# Patient Record
Sex: Male | Born: 1980 | Race: White | Hispanic: No | Smoking: Never smoker
Health system: Southern US, Community
[De-identification: ages and names within clinical notes are randomized; demographics above are authoritative.]

## PROBLEM LIST (undated history)

## (undated) DIAGNOSIS — J449 Chronic obstructive pulmonary disease, unspecified: Secondary | ICD-10-CM

## (undated) DIAGNOSIS — I82409 Acute embolism and thrombosis of unspecified deep veins of unspecified lower extremity: Secondary | ICD-10-CM

## (undated) DIAGNOSIS — F419 Anxiety disorder, unspecified: Secondary | ICD-10-CM

## (undated) DIAGNOSIS — I2699 Other pulmonary embolism without acute cor pulmonale: Secondary | ICD-10-CM

## (undated) HISTORY — PX: FRACTURE SURGERY: SHX138

## (undated) HISTORY — DX: Chronic obstructive pulmonary disease, unspecified: J44.9

## (undated) HISTORY — DX: Anxiety disorder, unspecified: F41.9

---

## 2005-03-10 ENCOUNTER — Emergency Department: Payer: Self-pay | Admitting: Unknown Physician Specialty

## 2006-09-17 ENCOUNTER — Ambulatory Visit: Payer: Self-pay | Admitting: Family Medicine

## 2007-11-23 ENCOUNTER — Emergency Department: Payer: Self-pay | Admitting: Emergency Medicine

## 2008-01-08 ENCOUNTER — Ambulatory Visit: Payer: Self-pay | Admitting: Orthopedic Surgery

## 2008-01-09 ENCOUNTER — Ambulatory Visit: Payer: Self-pay | Admitting: Orthopedic Surgery

## 2008-12-28 ENCOUNTER — Ambulatory Visit: Payer: Self-pay | Admitting: Internal Medicine

## 2011-09-14 ENCOUNTER — Emergency Department: Payer: Self-pay | Admitting: Emergency Medicine

## 2011-09-14 LAB — PROTIME-INR
INR: 1
Prothrombin Time: 13.5 secs (ref 11.5–14.7)

## 2011-09-14 LAB — COMPREHENSIVE METABOLIC PANEL
Albumin: 4.2 g/dL (ref 3.4–5.0)
Alkaline Phosphatase: 54 U/L (ref 50–136)
BUN: 5 mg/dL — ABNORMAL LOW (ref 7–18)
Bilirubin,Total: 1 mg/dL (ref 0.2–1.0)
Calcium, Total: 9 mg/dL (ref 8.5–10.1)
Chloride: 102 mmol/L (ref 98–107)
Co2: 30 mmol/L (ref 21–32)
Creatinine: 0.85 mg/dL (ref 0.60–1.30)
EGFR (African American): >60
Osmolality: 275 (ref 275–301)
SGOT(AST): 16 U/L (ref 15–37)
SGPT (ALT): 17 U/L
Total Protein: 7.4 g/dL (ref 6.4–8.2)

## 2011-09-14 LAB — CBC
HGB: 15.7 g/dL (ref 13.0–18.0)
MCH: 31.7 pg (ref 26.0–34.0)
MCV: 92 fL (ref 80–100)
Platelet: 250 10*3/uL (ref 150–440)
RDW: 13.3 % (ref 11.5–14.5)

## 2011-09-14 LAB — TROPONIN I: Troponin-I: <0.02 ng/mL

## 2011-09-14 LAB — CK TOTAL AND CKMB (NOT AT ARMC): CK-MB: <0.5 ng/mL — ABNORMAL LOW (ref 0.5–3.6)

## 2012-04-16 ENCOUNTER — Emergency Department: Payer: Self-pay

## 2012-04-16 LAB — BASIC METABOLIC PANEL
Anion Gap: 11 (ref 7–16)
Chloride: 105 mmol/L (ref 98–107)
Co2: 25 mmol/L (ref 21–32)
EGFR (Non-African Amer.): >60
Osmolality: 280 (ref 275–301)
Potassium: 3.7 mmol/L (ref 3.5–5.1)
Sodium: 141 mmol/L (ref 136–145)

## 2012-04-16 LAB — CBC
HCT: 49.1 % (ref 40.0–52.0)
HGB: 16.4 g/dL (ref 13.0–18.0)
MCH: 31.7 pg (ref 26.0–34.0)
MCHC: 33.5 g/dL (ref 32.0–36.0)
MCV: 95 fL (ref 80–100)
Platelet: 282 10*3/uL (ref 150–440)
RDW: 12.4 % (ref 11.5–14.5)

## 2013-01-04 ENCOUNTER — Emergency Department: Payer: Self-pay | Admitting: Emergency Medicine

## 2013-07-24 ENCOUNTER — Emergency Department: Payer: Self-pay | Admitting: Emergency Medicine

## 2013-07-24 LAB — RAPID INFLUENZA A&B ANTIGENS

## 2013-10-14 IMAGING — US US EXTREM LOW VENOUS*R*
1 series · 14 of 24 positions shown · non-contrast
Comparison: none

REASON FOR EXAM: pain; h/o DVT
COMMENTS:

[Series 1: us extrem low venous*right* · 0.07mm/px · 14 of 32 slices shown]
[im 1/32]
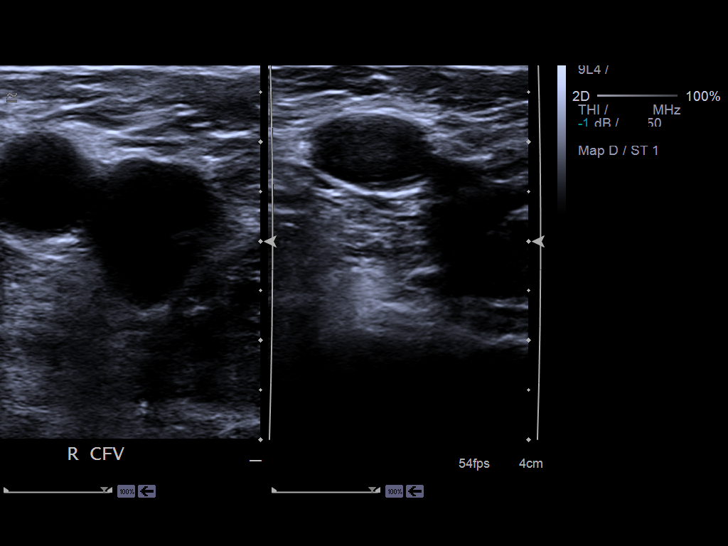
[im 3/32]
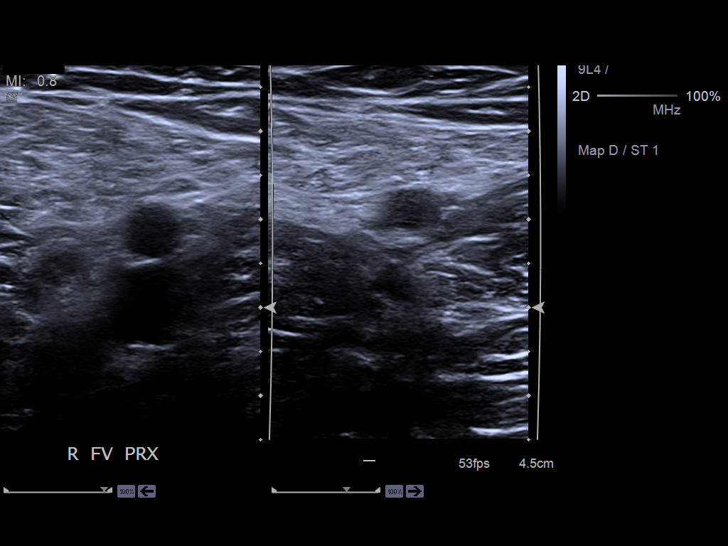
[im 6/32]
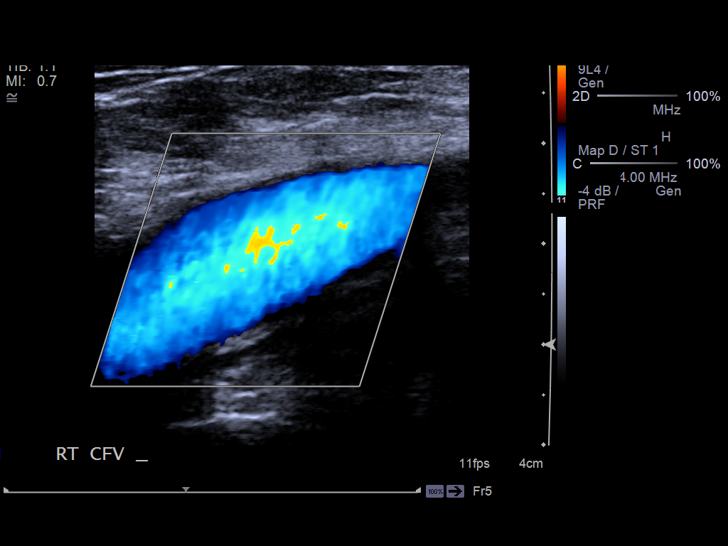
[im 9/32]
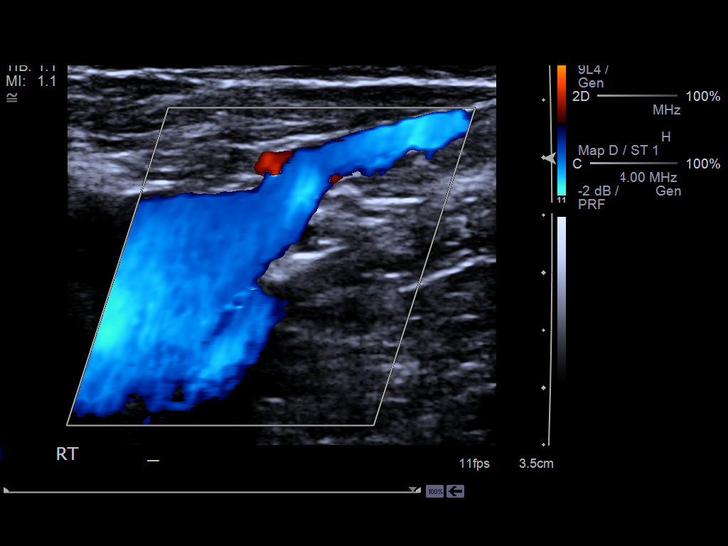
[im 10/32]
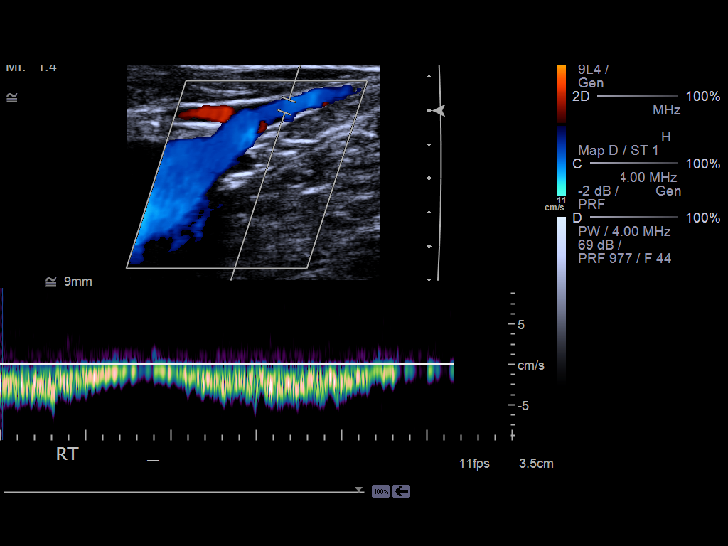
[im 13/32]
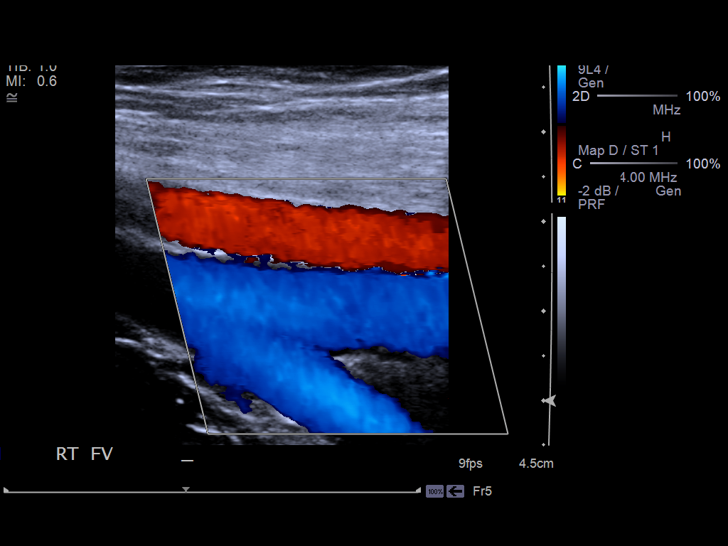
[im 15/32]
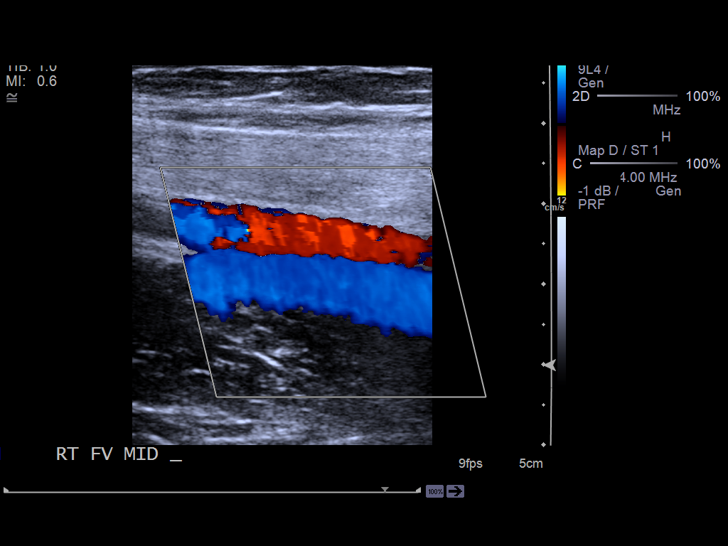
[im 17/32]
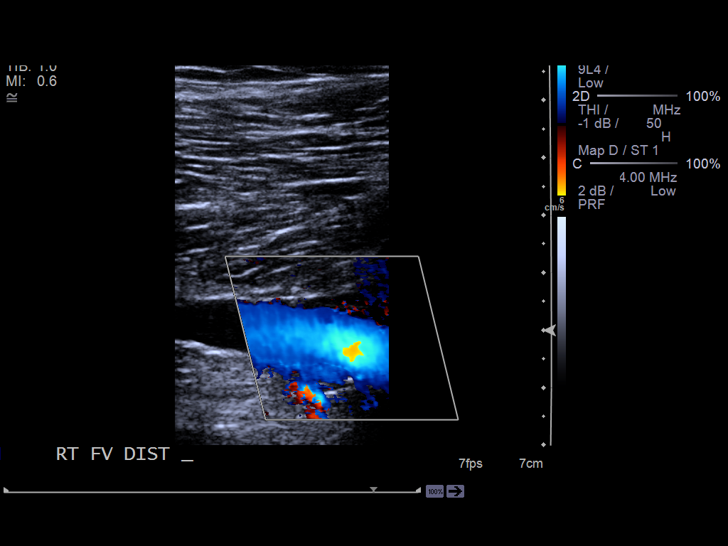
[im 19/32]
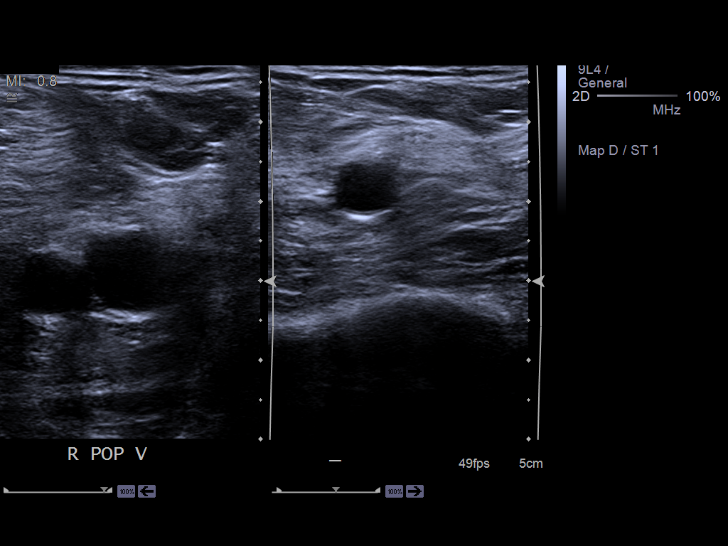
[im 22/32]
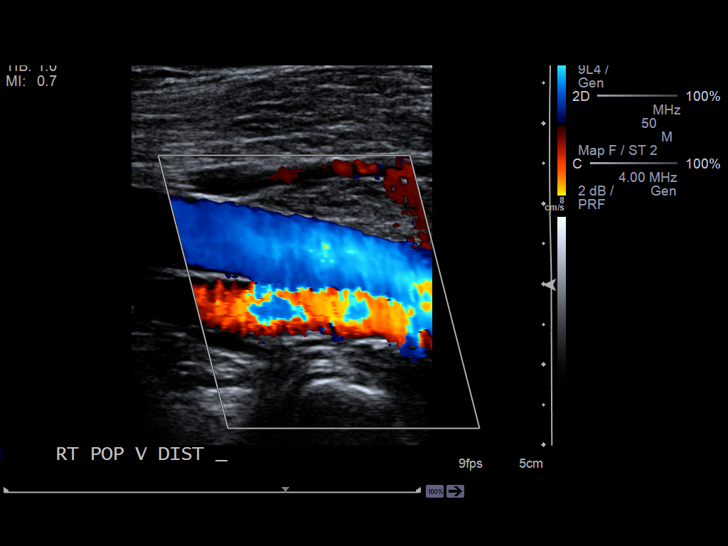
[im 25/32]
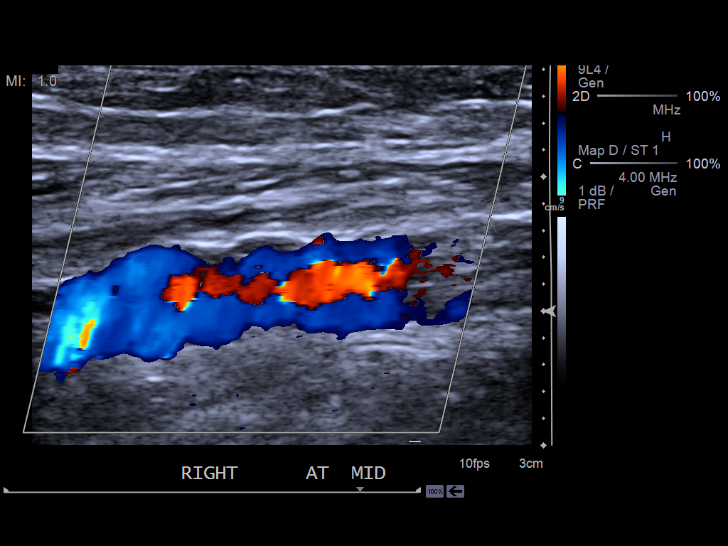
[im 26/32]
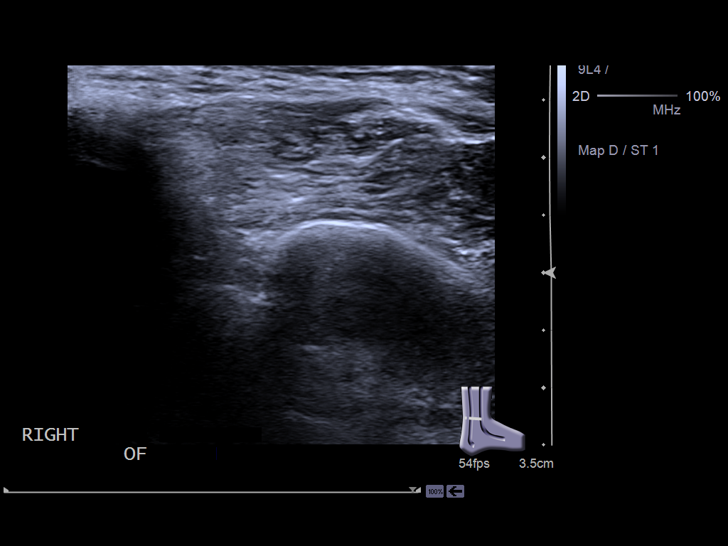
[im 29/32]
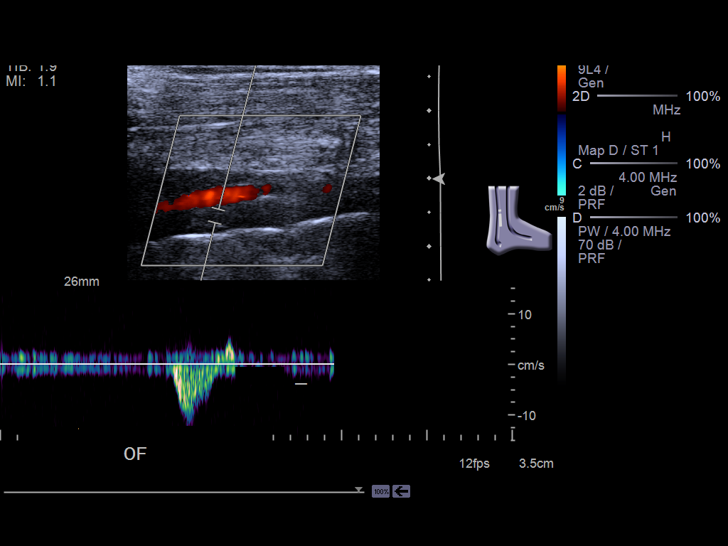
[im 32/32]
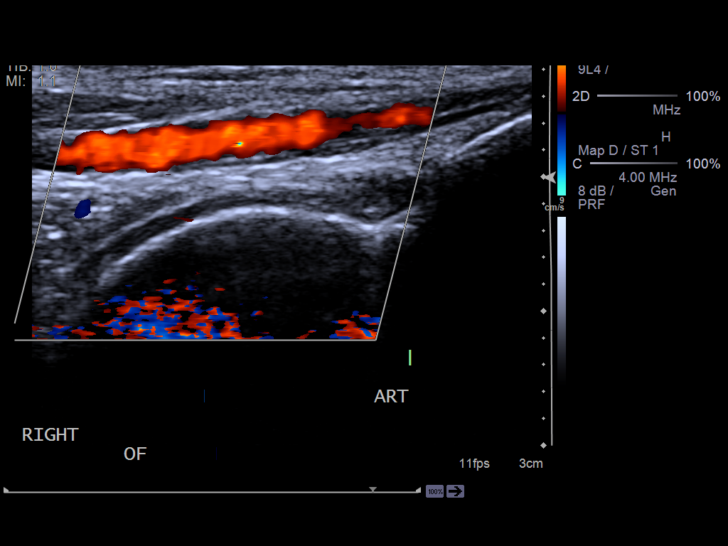

[14 of 24 positions shown; findings below may reference images not displayed]

PROCEDURE:     US  - US DOPPLER LOW EXTR RIGHT  - April 16, 2012  [DATE]

RESULT:     The right common femoral, superficial femoral, and popliteal
veins were interrogated using grayscale and color flow Doppler.

The vessels are normally compressible. The waveform patterns are normal and
the color flow images are normal. The response to the augmentation and
Valsalva maneuvers is normal. Survey views of the area of discomfort over
the lateral ankle reveals no suspicious findings.
IMPRESSION: There is no evidence of thrombus within the right femoral
or popliteal veins.

[REDACTED]

## 2013-12-31 ENCOUNTER — Emergency Department: Payer: Self-pay | Admitting: Emergency Medicine

## 2014-01-12 ENCOUNTER — Ambulatory Visit: Payer: Self-pay | Admitting: Internal Medicine

## 2014-01-12 LAB — COMPREHENSIVE METABOLIC PANEL
ALBUMIN: 3.9 g/dL (ref 3.4–5.0)
ALT: 34 U/L (ref 12–78)
AST: 32 U/L (ref 15–37)
Alkaline Phosphatase: 63 U/L
Anion Gap: 6 — ABNORMAL LOW (ref 7–16)
BUN: 6 mg/dL — AB (ref 7–18)
Bilirubin,Total: 0.9 mg/dL (ref 0.2–1.0)
CALCIUM: 8.5 mg/dL (ref 8.5–10.1)
CHLORIDE: 103 mmol/L (ref 98–107)
CREATININE: 0.92 mg/dL (ref 0.60–1.30)
Co2: 28 mmol/L (ref 21–32)
EGFR (African American): >60
EGFR (Non-African Amer.): >60
GLUCOSE: 91 mg/dL (ref 65–99)
Osmolality: 271 (ref 275–301)
POTASSIUM: 3.7 mmol/L (ref 3.5–5.1)
SODIUM: 137 mmol/L (ref 136–145)
Total Protein: 7.3 g/dL (ref 6.4–8.2)

## 2014-01-12 LAB — CBC CANCER CENTER
BASOS ABS: 0.1 x10 3/mm (ref 0.0–0.1)
BASOS PCT: 1 %
EOS PCT: 3.1 %
Eosinophil #: 0.2 x10 3/mm (ref 0.0–0.7)
HCT: 47.7 % (ref 40.0–52.0)
HGB: 16.2 g/dL (ref 13.0–18.0)
LYMPHS PCT: 29.1 %
Lymphocyte #: 2.1 x10 3/mm (ref 1.0–3.6)
MCH: 32.4 pg (ref 26.0–34.0)
MCHC: 33.9 g/dL (ref 32.0–36.0)
MCV: 96 fL (ref 80–100)
MONO ABS: 0.5 x10 3/mm (ref 0.2–1.0)
Monocyte %: 7 %
NEUTROS PCT: 59.8 %
Neutrophil #: 4.4 x10 3/mm (ref 1.4–6.5)
PLATELETS: 290 x10 3/mm (ref 150–440)
RBC: 4.99 10*6/uL (ref 4.40–5.90)
RDW: 13 % (ref 11.5–14.5)
WBC: 7.3 x10 3/mm (ref 3.8–10.6)

## 2014-01-12 LAB — TSH: THYROID STIMULATING HORM: 2.93 u[IU]/mL

## 2014-01-23 ENCOUNTER — Ambulatory Visit: Payer: Self-pay | Admitting: Internal Medicine

## 2014-07-04 IMAGING — CR DG FOOT COMPLETE 3+V*L*
1 series · 3 of 3 positions shown · non-contrast
Comparison: none

REASON FOR EXAM: basketball injury; unable to bear weight
COMMENTS:

PROCEDURE:     DXR - DXR FOOT LT COMP W/OBLIQUES  - January 04, 2013 [DATE]
RESULT:     Comparison: None.

[Series 4: x foot ap left · 0.14mm/px · 3 of 3 slices shown]
[im 1/3]
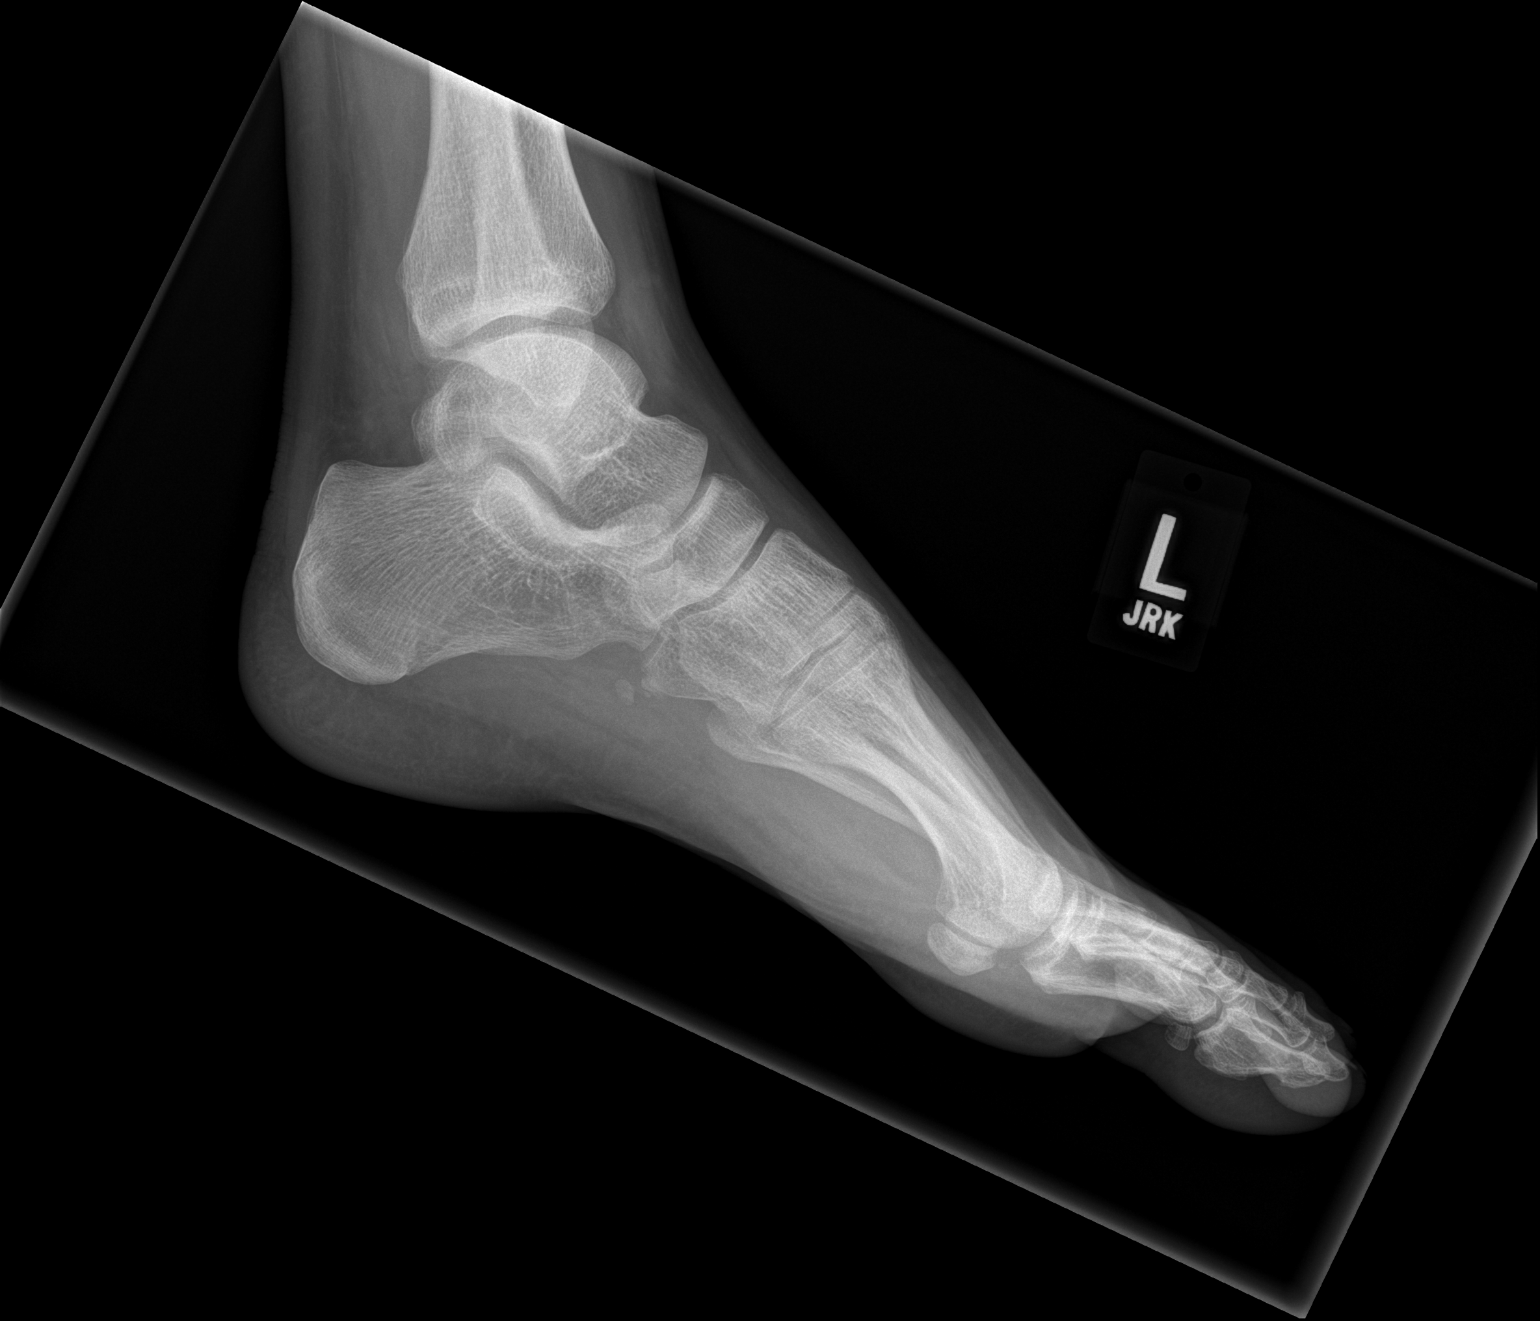
[im 2/3]
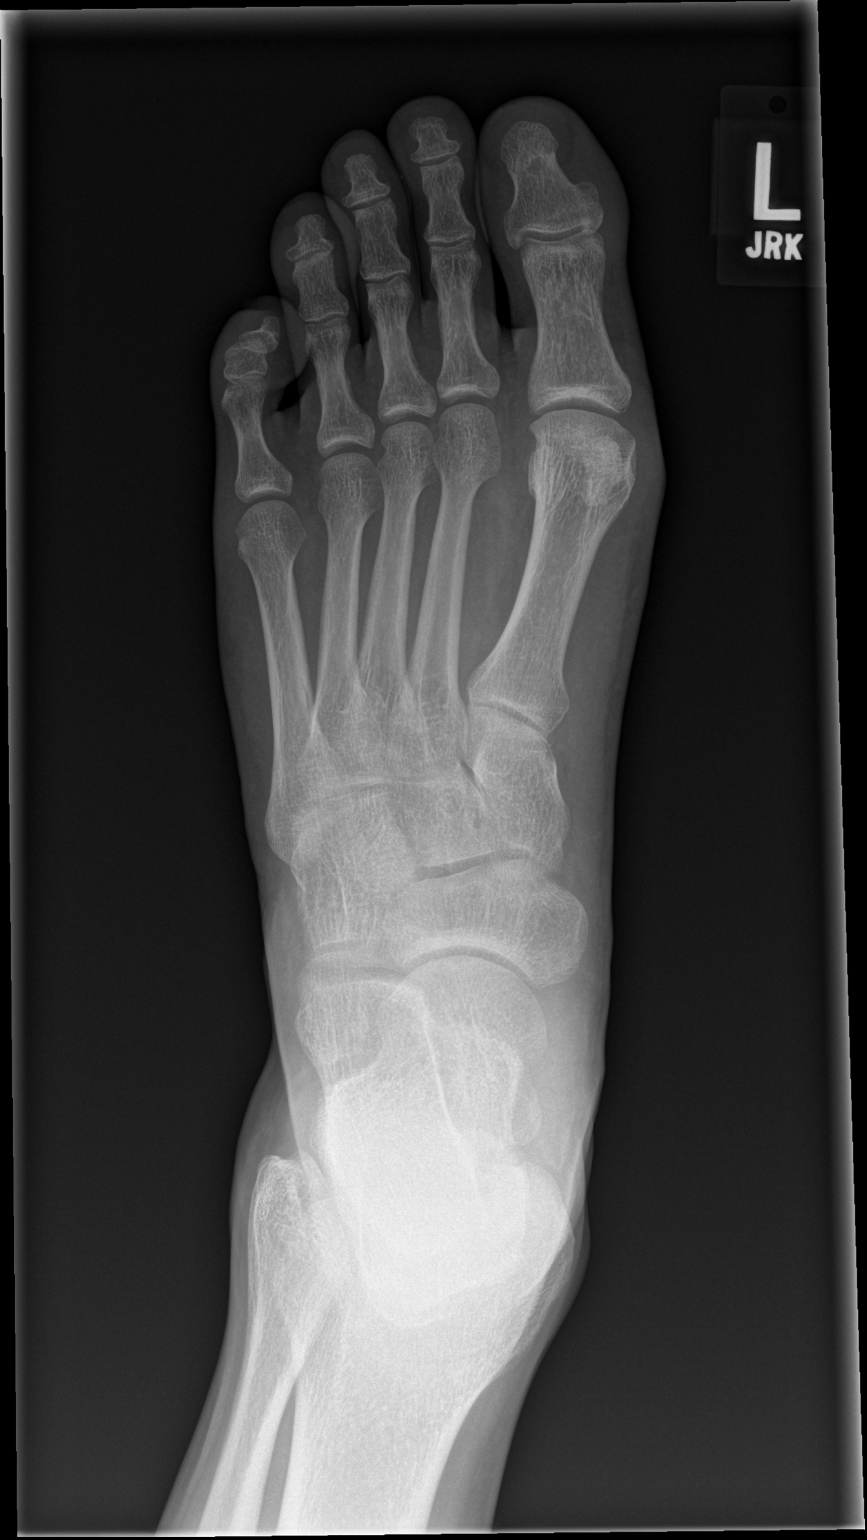
[im 3/3]
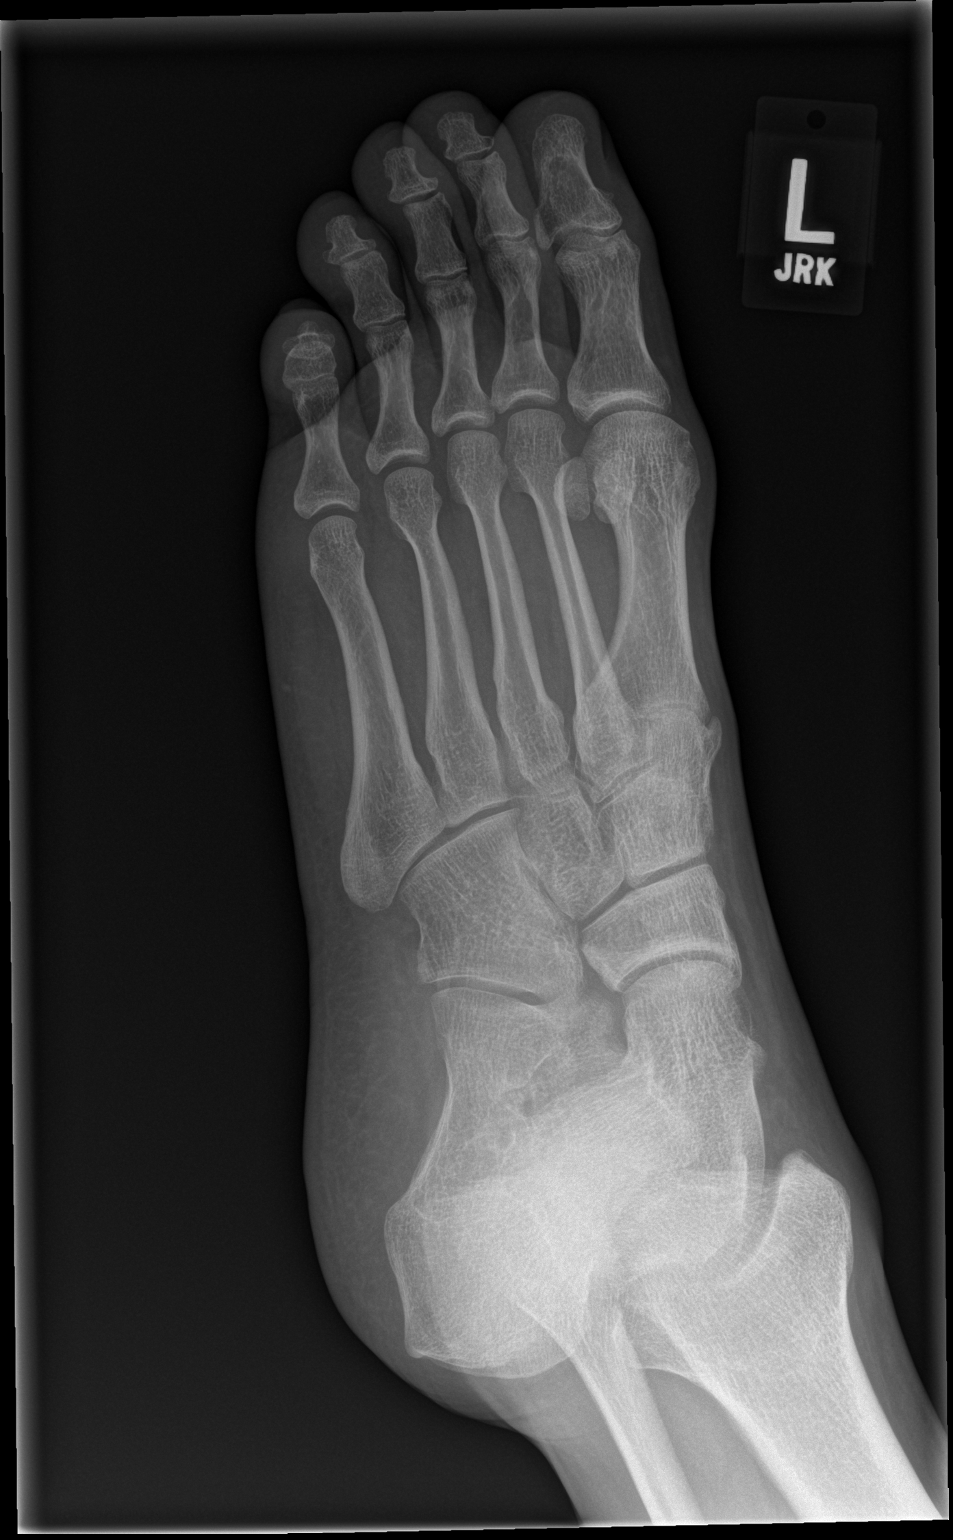

[3 of 3 positions shown; findings below may reference images not displayed]

FINDINGS: There are 2 small ossific densities just inferior to the calcaneocuboid
articulation. There is normal alignment in the foot.
IMPRESSION: Please see calcaneus radiograph report regarding small ossific densities
inferior to the calcaneocuboid articulation. Otherwise, no acute fracture
seen in the remainder of the foot.

[REDACTED]

## 2016-11-12 ENCOUNTER — Other Ambulatory Visit: Payer: Self-pay | Admitting: Family Medicine

## 2016-11-12 ENCOUNTER — Encounter: Payer: Self-pay | Admitting: Family Medicine

## 2017-01-11 ENCOUNTER — Ambulatory Visit: Payer: Self-pay | Admitting: Family Medicine

## 2017-01-21 ENCOUNTER — Ambulatory Visit: Payer: Medicaid Other | Admitting: Family Medicine

## 2017-02-20 ENCOUNTER — Ambulatory Visit: Payer: Medicaid Other | Admitting: Family Medicine

## 2017-04-02 ENCOUNTER — Emergency Department: Payer: Medicaid Other

## 2017-04-02 ENCOUNTER — Emergency Department
Admission: EM | Admit: 2017-04-02 | Discharge: 2017-04-02 | Disposition: A | Discharge status: Home / Self Care | Payer: Medicaid Other | Attending: Emergency Medicine | Admitting: Emergency Medicine

## 2017-04-02 DIAGNOSIS — Z86711 Personal history of pulmonary embolism: Secondary | ICD-10-CM | POA: Insufficient documentation

## 2017-04-02 DIAGNOSIS — I82442 Acute embolism and thrombosis of left tibial vein: Secondary | ICD-10-CM | POA: Insufficient documentation

## 2017-04-02 DIAGNOSIS — M79605 Pain in left leg: Secondary | ICD-10-CM | POA: Diagnosis present

## 2017-04-02 HISTORY — DX: Other pulmonary embolism without acute cor pulmonale: I26.99

## 2017-04-02 HISTORY — DX: Acute embolism and thrombosis of unspecified deep veins of unspecified lower extremity: I82.409

## 2017-04-02 LAB — CBC WITH DIFFERENTIAL/PLATELET
Basophils Absolute: 0.1 10*3/uL (ref 0–0.1)
Basophils Relative: 1 %
EOS ABS: 0.2 10*3/uL (ref 0–0.7)
Eosinophils Relative: 3 %
HCT: 49.6 % (ref 40.0–52.0)
HEMOGLOBIN: 17 g/dL (ref 13.0–18.0)
LYMPHS ABS: 1.1 10*3/uL (ref 1.0–3.6)
Lymphocytes Relative: 16 %
MCH: 31.5 pg (ref 26.0–34.0)
MCHC: 34.3 g/dL (ref 32.0–36.0)
MCV: 91.7 fL (ref 80.0–100.0)
MONOS PCT: 9 %
Monocytes Absolute: 0.6 10*3/uL (ref 0.2–1.0)
Neutro Abs: 5.1 10*3/uL (ref 1.4–6.5)
Neutrophils Relative %: 71 %
Platelets: 290 10*3/uL (ref 150–440)
RBC: 5.41 MIL/uL (ref 4.40–5.90)
RDW: 13.3 % (ref 11.5–14.5)
WBC: 7.1 10*3/uL (ref 3.8–10.6)

## 2017-04-02 LAB — BASIC METABOLIC PANEL
Anion gap: 7 (ref 5–15)
BUN: 10 mg/dL (ref 6–20)
CHLORIDE: 107 mmol/L (ref 101–111)
CO2: 25 mmol/L (ref 22–32)
CREATININE: 0.94 mg/dL (ref 0.61–1.24)
Calcium: 9 mg/dL (ref 8.9–10.3)
GFR calc Af Amer: >60 mL/min (ref >60)
GFR calc non Af Amer: >60 mL/min (ref >60)
Glucose, Bld: 104 mg/dL — ABNORMAL HIGH (ref 65–99)
Potassium: 4.4 mmol/L (ref 3.5–5.1)
SODIUM: 139 mmol/L (ref 135–145)

## 2017-04-02 LAB — PROTIME-INR
INR: 1.07
PROTHROMBIN TIME: 13.8 s (ref 11.4–15.2)

## 2017-04-02 LAB — APTT: aPTT: 30 seconds (ref 24–36)

## 2017-04-02 MED ORDER — APIXABAN 5 MG PO TABS: ORAL_TABLET | ORAL | 2 refills | Status: DC

## 2017-04-02 NOTE — Discharge Instructions (Signed)
You have been seen in the Emergency Department (ED) today and diagnosed with a deep vein thrombosis (DVT), which is a blood clot in one of your veins.  As we discussed, we are starting you on a blood thinner.  Though these carry with them the risk of bleeding, the risks of not treating your DVT (stroke, a clot in your lungs, etc.) are greater than the risk of taking the medication. ° °Please follow up with the doctor listed in these papers as recommended regarding today's ED visit.     ° °Return to the ED if you have worsening pain, ANY trouble breathing, chest pain, or other new or worsening symptoms that concern you. ° °

## 2017-04-02 NOTE — ED Provider Notes (Signed)
[facility] Emergency Department Provider Note  ____________________________________________   First MD Initiated Contact with Patient 04/02/17 1710     (approximate)  I have reviewed the triage vital signs and the nursing notes.   HISTORY  Chief Complaint Leg Pain    HPI Matthew Graves is a 36 y.o. 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  He reports that it feels similar to prior blood clots.  He has had no swelling and no numbness nor tingling.  The pain is mild to moderate and worse with ambulation and when his leg is not elevated. It starts just below his left knee and is primarily in the calf, radiating up and down the leg.  he denies fever/chills, chest pain, shortness of breath, nausea, vomiting, and abdominal pain.  He reports that years ago when he was first diagnosed he had an extensive workup at the cancer center but they could not find the cause of his coagulation issues.  He was on Xarelto for "a while", but he did not like the way it made him feel so he stopped taking it and has not taken it for years.  He has not had any recent traumas, no recent surgeries, no recent immobilizations nor long trips.   Past Medical History:  Diagnosis Date  . DVT (deep venous thrombosis) (HCC)    multiple, in both upper and lower extremities  . Pulmonary embolism (HCC)     There are no active problems to display for this patient.   History reviewed. No pertinent surgical history.  Prior to Admission medications   Medication Sig Start Date End Date Taking? Authorizing Provider  apixaban (ELIQUIS) 5 MG TABS tablet Take 2 tablets (10 mg) PO BID x 7 days, then reduce dose to 1 tablet (5 mg) PO BID. 04/02/17   Loleta Rose, MD    Allergies Patient has no known  allergies.  History reviewed. No pertinent family history.  Social History Social History  Substance Use Topics  . Smoking status: Never Smoker  . Smokeless tobacco: Never Used  . Alcohol use Yes    Review of Systems Constitutional: No fever/chills Eyes: No visual changes. ENT: No sore throat. Cardiovascular: Denies chest pain. Respiratory: Denies shortness of breath. Gastrointestinal: No abdominal pain.  No nausea, no vomiting.  No diarrhea.  No constipation. Genitourinary: Negative for dysuria. Musculoskeletal: pain in the left lower extremity from the knee down with no swelling. Negative for neck pain.  Negative for back pain. Integumentary: Negative for rash. Neurological: Negative for headaches, focal weakness or numbness.   ____________________________________________   PHYSICAL EXAM:  VITAL SIGNS: ED Triage Vitals [04/02/17 1450]  Enc Vitals Group     BP 137/73     Pulse Rate 99     Resp 18     Temp 98.3 F (36.8 C)     Temp Source Oral     SpO2 96 %     Weight 86.2 kg (190 lb)     Height 1.88 m ( )     Head Circumference      Peak Flow      Pain Score 3     Pain Loc      Pain Edu?      Excl. in GC?     Constitutional: Alert and oriented. Well  appearing and in no acute distress. Eyes: Conjunctivae are normal.  Head: Atraumatic. Nose: No congestion/rhinnorhea. Mouth/Throat: Mucous membranes are moist. Neck: No stridor.  No meningeal signs.   Cardiovascular: Normal rate, regular rhythm. Good peripheral circulation. Grossly normal heart sounds. Respiratory: Normal respiratory effort.  No retractions. Lungs CTAB. Gastrointestinal: Soft and nontender. No distention.  Musculoskeletal: No lower extremity tenderness nor edema. No gross deformities of extremities. no swelling of the left lower extremity compared to the right.  No palpable cords behind the knee in the popliteal fossa.  Neurovascularly intact with palpable pulses in the left foot.    Neurologic:  Normal speech and language. No gross focal neurologic deficits are appreciated.  Skin:  Skin is warm, dry and intact. No rash noted. Psychiatric: Mood and affect are normal. Speech and behavior are normal.  ____________________________________________   LABS (all labs ordered are listed, but only abnormal results are displayed)  Labs Reviewed  BASIC METABOLIC PANEL - Abnormal; Notable for the following:       Result Value   Glucose, Bld 104 (*)    All other components within normal limits  CBC WITH DIFFERENTIAL/PLATELET  PROTIME-INR  APTT   ____________________________________________  EKG  None - EKG not ordered by ED physician ____________________________________________  RADIOLOGY   US Venous Img Lower Unilateral Left  Result Date: 04/02/2017 CLINICAL DATA:  Pain.  Evaluate for blood clot. EXAM: Left LOWER EXTREMITY VENOUS DOPPLER ULTRASOUND TECHNIQUE: Gray-scale sonography with graded compression, as well as color Doppler and duplex ultrasound were performed to evaluate the lower extremity deep venous systems from the level of the common femoral vein and including the common femoral, femoral, profunda femoral, popliteal and calf veins including the posterior tibial, peroneal and gastrocnemius veins when visible. The superficial great saphenous vein was also interrogated. Spectral Doppler was utilized to evaluate flow at rest and with distal augmentation maneuvers in the common femoral, femoral and popliteal veins. COMPARISON:  None. FINDINGS: Contralateral Common Femoral Vein: Respiratory phasicity is normal and symmetric with the symptomatic side. No evidence of thrombus. Normal compressibility. Common Femoral Vein: No evidence of thrombus. Normal compressibility, respiratory phasicity and response to augmentation. Saphenofemoral Junction: No evidence of thrombus. Normal compressibility and flow on color Doppler imaging. Profunda Femoral Vein: No evidence of  thrombus. Normal compressibility and flow on color Doppler imaging. Femoral Vein: No evidence of thrombus. Normal compressibility, respiratory phasicity and response to augmentation. Popliteal Vein: No evidence of thrombus. Normal compressibility, respiratory phasicity and response to augmentation. Calf Veins: Occlusive thrombus identified within the posterior tibial vein and both peroneal veins. Superficial Great Saphenous Vein: No evidence of thrombus. Normal compressibility and flow on color Doppler imaging. Venous Reflux:  None. Other Findings:  None. IMPRESSION: 1. Examination is positive for occlusive thrombus within the posterior tibial vein of the left lower extremity and both peroneal veins of the left calf. These results will be called to the ordering clinician or representative by the Radiologist Assistant, and communication documented in the PACS or zVision Dashboard. Electronically Signed   By: Signa Kell M.D.   On: 04/02/2017 15:56    ____________________________________________   PROCEDURES  Critical Care performed: No   Procedure(s) performed:   Procedures   ____________________________________________   INITIAL IMPRESSION / ASSESSMENT AND PLAN / ED COURSE      when I saw the patient the ultrasound had already come back and demonstrated occlusive DVT in the posterior tibial and peroneal veins of the left calf.  He is neurovascularly intact and has  good arterial supply with minimal symptoms.  We had a discussion about medication compliance.  He is willing to start on L Oquist to try that as an alternative to see her also and he states that he will follow up with hematology.  I will give him contact information so that he can follow-up.  I am sending Baseline labs, but he has no history of any renal issues and has a healthy body habitus with no other known medical problems.  He needs to hurry to get to the pharmacy so I will not keep him for the lab results but rather  discharged him with my usual and customary return precautions.  He agrees with the plan.     ____________________________________________  FINAL CLINICAL IMPRESSION(S) / ED DIAGNOSES  Final diagnoses:  Acute deep vein thrombosis (DVT) of tibial vein of left lower extremity (HCC)     MEDICATIONS GIVEN DURING THIS VISIT:  Medications - No data to display   NEW OUTPATIENT MEDICATIONS STARTED DURING THIS VISIT:  Discharge Medication List as of 04/02/2017  6:07 PM    START taking these medications   Details  apixaban (ELIQUIS) 5 MG TABS tablet Take 2 tablets (10 mg) PO BID x 7 days, then reduce dose to 1 tablet (5 mg) PO BID., Print        Discharge Medication List as of 04/02/2017  6:07 PM      Discharge Medication List as of 04/02/2017  6:07 PM       Note:  This document was prepared using Dragon voice recognition software and may include unintentional dictation errors.    Loleta Rose, MD 04/02/17 2030

## 2017-04-02 NOTE — ED Triage Notes (Signed)
Pain to left lower leg behind knee down to left foot x 1 week. Hx of blood clots. Pt prescribed Xarelto but is non compliant with it. Pt alert and oriented X4, active, cooperative, pt in NAD. RR even and unlabored, color WNL.

## 2017-04-05 ENCOUNTER — Ambulatory Visit: Payer: Medicaid Other | Admitting: Family Medicine

## 2017-05-28 ENCOUNTER — Ambulatory Visit: Payer: Medicaid Other | Admitting: Family Medicine

## 2017-05-28 ENCOUNTER — Encounter: Payer: Self-pay | Admitting: Family Medicine

## 2017-05-28 VITALS — BP 137/96 | HR 99 | Temp 98.4°F | Ht 73.3 in | Wt 190.5 lb

## 2017-05-28 DIAGNOSIS — J449 Chronic obstructive pulmonary disease, unspecified: Secondary | ICD-10-CM | POA: Insufficient documentation

## 2017-05-28 DIAGNOSIS — I82432 Acute embolism and thrombosis of left popliteal vein: Secondary | ICD-10-CM | POA: Diagnosis not present

## 2017-05-28 DIAGNOSIS — K4090 Unilateral inguinal hernia, without obstruction or gangrene, not specified as recurrent: Secondary | ICD-10-CM | POA: Diagnosis not present

## 2017-05-28 DIAGNOSIS — F419 Anxiety disorder, unspecified: Secondary | ICD-10-CM

## 2017-05-28 DIAGNOSIS — Z86711 Personal history of pulmonary embolism: Secondary | ICD-10-CM | POA: Diagnosis not present

## 2017-05-28 DIAGNOSIS — Z86718 Personal history of other venous thrombosis and embolism: Secondary | ICD-10-CM | POA: Diagnosis not present

## 2017-05-28 DIAGNOSIS — I82409 Acute embolism and thrombosis of unspecified deep veins of unspecified lower extremity: Secondary | ICD-10-CM | POA: Diagnosis not present

## 2017-05-28 MED ORDER — CITALOPRAM HYDROBROMIDE 20 MG PO TABS: 20.0000 mg | ORAL_TABLET | Freq: Every day | ORAL | 3 refills | Status: DC

## 2017-05-28 NOTE — Progress Notes (Signed)
BP (!) 137/96 (BP Location: Left Arm, Patient Position: Sitting, Cuff Size: Normal)   Pulse 99   Temp 98.4 F (36.9 C)   Ht 6' 1.3" (1.862 m)   Wt 190 lb 8 oz (86.4 kg)   SpO2 99%   BMI 24.93 kg/m    Subjective:    Patient ID: Matthew Graves, male    DOB: August 09, 1980, 36 y.o.   MRN: [id number]  HPI: Matthew Graves is a 36 y.o. male who presents today to establish care.   Chief Complaint  Patient presents with  . Other    Recurrent blood clots  . Hernia  . COPD  . Anxiety   Diagnosed with DVT October 8th- on the eliquis. First 1 on Feb 2010 and had a PE with that one too. Had to have shots and go once a week. Has been on coumadin, xarelto, warfarin and eliquis. Has come off of all of them in the past because of the tiredness and then he'll get another clot. He has had 4 in the last 8 years. Sat in a deer stand for about 3 hours before the last one. No other provoking factors. Has had them in his arms as well. His grandparents on his father's side had blood clots but in old age. No other family history.   Has had issues with his breathing since he was a kid. Had sports induced asthma. Now has COPD. Has some inhalers at home, but doesn't seem to take them. He notes that the best thing he has done was quitting smoker. Has rescue inhalers at home, but has never been on any preventive medicine.   HERNIA Duration: 1 year Location: right groin Painful: Not any more Discomfort: yes- very rarely Bulge: no Quality:  none Onset: sudden Severity: mild Context: better Aggravating factors: moving a certain way  ANXIETY/STRESS- since childhood, nervous in crowds, doesn't like anyone behind him, he has been on other medicines in the past and isn't sure what he was taking. He hasn't been on anything recently. He had been taking low level zoloft. No bad reactions.  Duration:uncontrolled Anxious mood: yes  Excessive worrying: yes Irritability: yes  Sweating: yes Nausea:  no Palpitations:yes Hyperventilation: yes Panic attacks: yes Agoraphobia: yes  Obscessions/compulsions: no Depressed mood: yes Depression screen [facility] 2/9 05/28/2017 05/28/2017  Decreased Interest 1 1  Down, Depressed, Hopeless 1 0  PHQ - 2 Score 2 1  Altered sleeping 2 -  Tired, decreased energy 2 -  Change in appetite 1 -  Feeling bad or failure about yourself  1 -  Trouble concentrating 0 -  Moving slowly or fidgety/restless 1 -  Suicidal thoughts 0 -  PHQ-9 Score 9 -   GAD 7 : Generalized Anxiety Score 05/28/2017  Nervous, Anxious, on Edge 3  Control/stop worrying 3  Worry too much - different things 3  Trouble relaxing 3  Restless 2  Easily annoyed or irritable 3  Afraid - awful might happen 3  Total GAD 7 Score 20    Anhedonia: no Weight changes: no Insomnia: yes both to fall asleep and stay asleep  Hypersomnia: no Fatigue/loss of energy: yes Feelings of worthlessness: no Feelings of guilt: no Impaired concentration/indecisiveness: no Suicidal ideations: no  Crying spells: no Recent Stressors/Life Changes: no   Relationship problems: no   Family stress: no     Financial stress: no    Job stress: no    Recent death/loss: no  Active Ambulatory Problems  Diagnosis Date Noted  . COPD (chronic obstructive pulmonary disease) (HCC)   . Anxiety   . DVT (deep venous thrombosis) (HCC)   . History of DVT (deep vein thrombosis) 05/28/2017  . History of pulmonary embolism 05/28/2017  . Recurrent deep vein thrombosis (HCC) 05/29/2017   Resolved Ambulatory Problems    Diagnosis Date Noted  . No Resolved Ambulatory Problems   Past Medical History:  Diagnosis Date  . Anxiety   . COPD (chronic obstructive pulmonary disease) (HCC)   . DVT (deep venous thrombosis) (HCC)   . Pulmonary embolism [facility])    Past Surgical History:  Procedure Laterality Date  . FRACTURE SURGERY     pins in fingers    Outpatient Encounter Medications as of 05/28/2017  Medication Sig   . ALPRAZolam (XANAX) 1 MG tablet TAKE ONE TABLET BY MOUTH 3 TIMES A DAY  . apixaban (ELIQUIS) 5 MG TABS tablet Take 2 tablets (10 mg) PO BID x 7 days, then reduce dose to 1 tablet (5 mg) PO BID.  Marland Kitchen citalopram (CELEXA) 20 MG tablet Take 1 tablet (20 mg total) by mouth daily.  Marland Kitchen zolpidem (AMBIEN) 10 MG tablet TAKE 1 TABLET BY MOUTH NIGHTLY AS NEEDED   No facility-administered encounter medications on file as of 05/28/2017.    Social History   Socioeconomic History  . Marital status: Divorced    Spouse name: None  . Number of children: None  . Years of education: None  . Highest education level: None  Social Needs  . Financial resource strain: None  . Food insecurity - worry: None  . Food insecurity - inability: None  . Transportation needs - medical: None  . Transportation needs - non-medical: None  Occupational History  . None  Tobacco Use  . Smoking status: Never Smoker  . Smokeless tobacco: Current User    Types: Chew  Substance and Sexual Activity  . Alcohol use: Yes    Comment: On occasion  . Drug use: Yes    Types: Marijuana  . Sexual activity: Yes  Other Topics Concern  . None  Social History Narrative  . None   Family History  Problem Relation Age of Onset  . Rheum arthritis Mother   . Mental illness Mother        Anxiety  . COPD Father   . Mental illness Sister        Anxiety  . Mental illness Son        Anxiety  . Clotting disorder Paternal Grandmother   . Clotting disorder Paternal Grandfather     Social History   Socioeconomic History  . Marital status: Divorced    Spouse name: Not on file  . Number of children: Not on file  . Years of education: Not on file  . Highest education level: Not on file  Social Needs  . Financial resource strain: Not on file  . Food insecurity - worry: Not on file  . Food insecurity - inability: Not on file  . Transportation needs - medical: Not on file  . Transportation needs - non-medical: Not on file   Occupational History  . Not on file  Tobacco Use  . Smoking status: Never Smoker  . Smokeless tobacco: Current User    Types: Chew  Substance and Sexual Activity  . Alcohol use: Yes    Comment: On occasion  . Drug use: Yes    Types: Marijuana  . Sexual activity: Yes  Other Topics Concern  . Not on  file  Social History Narrative  . Not on file    Review of Systems  Constitutional: Negative.   Respiratory: Negative.   Cardiovascular: Negative.   Musculoskeletal: Negative.   Skin: Negative.   Psychiatric/Behavioral: Positive for sleep disturbance. Negative for agitation, behavioral problems, confusion, decreased concentration, dysphoric mood, hallucinations, self-injury and suicidal ideas. The patient is nervous/anxious. The patient is not hyperactive.     Per HPI unless specifically indicated above     Objective:    BP (!) 137/96 (BP Location: Left Arm, Patient Position: Sitting, Cuff Size: Normal)   Pulse 99   Temp 98.4 F (36.9 C)   Ht 6' 1.3" (1.862 m)   Wt 190 lb 8 oz (86.4 kg)   SpO2 99%   BMI 24.93 kg/m   Wt Readings from Last 3 Encounters:  05/28/17 190 lb 8 oz (86.4 kg)  04/02/17 190 lb (86.2 kg)    Physical Exam  Constitutional: He is oriented to person, place, and time. He appears well-developed and well-nourished. No distress.  HENT:  Head: Normocephalic and atraumatic.  Right Ear: Hearing normal.  Left Ear: Hearing normal.  Nose: Nose normal.  Eyes: Conjunctivae and lids are normal. Right eye exhibits no discharge. Left eye exhibits no discharge. No scleral icterus.  Cardiovascular: Normal rate, regular rhythm, normal heart sounds and intact distal pulses. Exam reveals no gallop and no friction rub.  No murmur heard. Pulmonary/Chest: Effort normal and breath sounds normal. No respiratory distress. He has no wheezes. He has no rales. He exhibits no tenderness.  Abdominal: A hernia is present. Hernia confirmed positive in the right inguinal area.  Hernia confirmed negative in the left inguinal area.  Genitourinary: Testes normal and penis normal. Right testis shows no mass, no swelling and no tenderness. Right testis is descended. Cremasteric reflex is not absent on the right side. Left testis shows no mass, no swelling and no tenderness. Left testis is descended. Cremasteric reflex is not absent on the left side. Circumcised. No phimosis, paraphimosis, hypospadias, penile erythema or penile tenderness. No discharge found.  Musculoskeletal: Normal range of motion.  Lymphadenopathy:       Right: No inguinal adenopathy present.       Left: No inguinal adenopathy present.  Neurological: He is alert and oriented to person, place, and time.  Skin: Skin is warm, dry and intact. No rash noted. He is not diaphoretic. No erythema. No pallor.  Psychiatric: His speech is normal and behavior is normal. Judgment and thought content normal. His mood appears anxious. Cognition and memory are normal.  Nursing note and vitals reviewed.   Results for orders placed or performed during the hospital encounter of 04/02/17  CBC with Differential/Platelet  Result Value Ref Range   WBC 7.1 3.8 - 10.6 K/uL   RBC 5.41 4.40 - 5.90 MIL/uL   Hemoglobin 17.0 13.0 - 18.0 g/dL   HCT 84.649.6 96.240.0 - 95.252.0 %   MCV 91.7 80.0 - 100.0 fL   MCH 31.5 26.0 - 34.0 pg   MCHC 34.3 32.0 - 36.0 g/dL   RDW 84.113.3 32.411.5 - 40.114.5 %   Platelets 290 150 - 440 K/uL   Neutrophils Relative % 71 %   Neutro Abs 5.1 1.4 - 6.5 K/uL   Lymphocytes Relative 16 %   Lymphs Abs 1.1 1.0 - 3.6 K/uL   Monocytes Relative 9 %   Monocytes Absolute 0.6 0.2 - 1.0 K/uL   Eosinophils Relative 3 %   Eosinophils Absolute 0.2 0 - 0.7  K/uL   Basophils Relative 1 %   Basophils Absolute 0.1 0 - 0.1 K/uL  Basic metabolic panel  Result Value Ref Range   Sodium 139 135 - 145 mmol/L   Potassium 4.4 3.5 - 5.1 mmol/L   Chloride 107 101 - 111 mmol/L   CO2 25 22 - 32 mmol/L   Glucose, Bld 104 (H) 65 - 99 mg/dL    BUN 10 6 - 20 mg/dL   Creatinine, Ser 1.610.94 0.61 - 1.24 mg/dL   Calcium 9.0 8.9 - 09.610.3 mg/dL   GFR calc non Af Amer >60 >60 mL/min   GFR calc Af Amer >60 >60 mL/min   Anion gap 7 5 - 15  Protime-INR  Result Value Ref Range   Prothrombin Time 13.8 11.4 - 15.2 seconds   INR 1.07   APTT  Result Value Ref Range   aPTT 30 24 - 36 seconds      Assessment & Plan:   Problem List Items Addressed This Visit      Cardiovascular and Mediastinum   DVT (deep venous thrombosis) (HCC)    Currently on eliquis. Will likely need life-long treatment. Unclear if he has a clotting disorder. Will refer to hematology for evaluation.       Recurrent deep vein thrombosis (HCC) - Primary    Last DVT was in October. Currently on eliquis. Will likely need life-long treatment as he has had several DVTs unprovoked in the past. Unclear if he has a clotting disorder. Will refer to hematology for evaluation.       Relevant Orders   Ambulatory referral to Hematology     Respiratory   COPD (chronic obstructive pulmonary disease) (HCC)    Will start him on symbicort- samples given. Recheck with spiro in 2 weeks. Call with any concerns.         Other   Anxiety    Not under good control. Will try him on celexa and recheck 2 weeks to see how he's doing. Advised him that we don't prescribe controlled substances on the first visit. Will review his records and consider anti-anxiety at next visit.       Relevant Medications   ALPRAZolam (XANAX) 1 MG tablet   citalopram (CELEXA) 20 MG tablet   History of DVT (deep vein thrombosis)   Relevant Orders   Ambulatory referral to Hematology   History of pulmonary embolism   Relevant Orders   Ambulatory referral to Hematology    Other Visit Diagnoses    Non-recurrent unilateral inguinal hernia without obstruction or gangrene       Hernia R side- small. Will get him into general surgery for evaluation and to discuss options.    Relevant Orders   Ambulatory referral  to General Surgery       Follow up plan: Return in about 2 weeks (around 06/11/2017), or follow up breathing and mood with spiro, for REcords release please.

## 2017-05-29 ENCOUNTER — Telehealth: Payer: Self-pay | Admitting: General Surgery

## 2017-05-29 DIAGNOSIS — I82409 Acute embolism and thrombosis of unspecified deep veins of unspecified lower extremity: Secondary | ICD-10-CM | POA: Insufficient documentation

## 2017-05-29 NOTE — Assessment & Plan Note (Signed)
Will start him on symbicort- samples given. Recheck with spiro in 2 weeks. Call with any concerns.

## 2017-05-29 NOTE — Assessment & Plan Note (Signed)
Last DVT was in October. Currently on eliquis. Will likely need life-long treatment as he has had several DVTs unprovoked in the past. Unclear if he has a clotting disorder. Will refer to hematology for evaluation.

## 2017-05-29 NOTE — Assessment & Plan Note (Signed)
Not under good control. Will try him on celexa and recheck 2 weeks to see how he's doing. Advised him that we don't prescribe controlled substances on the first visit. Will review his records and consider anti-anxiety at next visit.

## 2017-05-29 NOTE — Assessment & Plan Note (Signed)
Currently on eliquis. Will likely need life-long treatment. Unclear if he has a clotting disorder. Will refer to hematology for evaluation.

## 2017-06-06 NOTE — Telephone Encounter (Signed)
ERROR

## 2017-06-10 ENCOUNTER — Encounter: Payer: Self-pay | Admitting: *Deleted

## 2017-06-12 ENCOUNTER — Ambulatory Visit: Payer: Medicaid Other | Admitting: Family Medicine

## 2017-06-28 ENCOUNTER — Encounter: Payer: Self-pay | Admitting: General Practice

## 2018-08-19 ENCOUNTER — Other Ambulatory Visit: Payer: Self-pay

## 2018-08-19 ENCOUNTER — Encounter: Payer: Self-pay | Admitting: Emergency Medicine

## 2018-08-19 ENCOUNTER — Ambulatory Visit
Admission: EM | Admit: 2018-08-19 | Discharge: 2018-08-19 | Disposition: A | Discharge status: Home / Self Care | Payer: Medicaid Other | Attending: Family Medicine | Admitting: Family Medicine

## 2018-08-19 DIAGNOSIS — F17220 Nicotine dependence, chewing tobacco, uncomplicated: Secondary | ICD-10-CM

## 2018-08-19 DIAGNOSIS — R590 Localized enlarged lymph nodes: Secondary | ICD-10-CM

## 2018-08-19 LAB — CBC WITH DIFFERENTIAL/PLATELET
ABS IMMATURE GRANULOCYTES: 0.02 10*3/uL (ref 0.00–0.07)
BASOS PCT: 1 %
Basophils Absolute: 0.1 10*3/uL (ref 0.0–0.1)
Eosinophils Absolute: 0.2 10*3/uL (ref 0.0–0.5)
Eosinophils Relative: 3 %
HCT: 40.8 % (ref 39.0–52.0)
Hemoglobin: 14.5 g/dL (ref 13.0–17.0)
IMMATURE GRANULOCYTES: 0 %
Lymphocytes Relative: 31 %
Lymphs Abs: 2.1 10*3/uL (ref 0.7–4.0)
MCH: 32.4 pg (ref 26.0–34.0)
MCHC: 35.5 g/dL (ref 30.0–36.0)
MCV: 91.1 fL (ref 80.0–100.0)
MONO ABS: 0.6 10*3/uL (ref 0.1–1.0)
Monocytes Relative: 8 %
NEUTROS ABS: 3.9 10*3/uL (ref 1.7–7.7)
NEUTROS PCT: 57 %
PLATELETS: 258 10*3/uL (ref 150–400)
RBC: 4.48 MIL/uL (ref 4.22–5.81)
RDW: 12.5 % (ref 11.5–15.5)
WBC: 7 10*3/uL (ref 4.0–10.5)
nRBC: 0 % (ref 0.0–0.2)

## 2018-08-19 NOTE — ED Provider Notes (Signed)
[facility]    CSN: [id number] Arrival date & time: 08/19/18  1916  History   Chief Complaint Chief Complaint  Patient presents with  . Adenopathy   HPI  38 year old male presents with adenopathy.  Patient reports that he has noticed an enlarged lymph node on the right side of his neck for the past week.  He noticed an additional note behind the right ear.  Denies pain.  States that he has been having headaches.  No reports of sore throat, fever.  Otherwise feels well.  No known inciting factor.  No known exacerbating factors. No other complaints.   PMH, Surgical Hx, Family Hx, Social History reviewed and updated as below.  Past Medical History:  Diagnosis Date  . Anxiety   . COPD (chronic obstructive pulmonary disease) (HCC)   . DVT (deep venous thrombosis) (HCC)    multiple, in both upper and lower extremities  . Pulmonary embolism [facility])     Patient Active Problem List   Diagnosis Date Noted  . 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 History of DVT (deep vein thrombosis) 05/28/2017  . History of pulmonary embolism 05/28/2017  . COPD (chronic obstructive pulmonary disease) (HCC)   . Anxiety   . DVT (deep venous thrombosis) (HCC)     Past Surgical History:  Procedure Laterality Date  . FRACTURE SURGERY     pins in fingers       Home Medications    Prior to Admission medications   Medication Sig Start Date End Date Taking? Authorizing Provider  ALPRAZolam Prudy Feeler) 1 MG tablet TAKE ONE TABLET BY MOUTH 3 TIMES A DAY 05/01/17  Yes [provider]    Family History Family History  Problem Relation Age of Onset  . Rheum arthritis Mother   . Mental illness Mother        Anxiety  . COPD Father   . Mental illness Sister        Anxiety  . Mental illness Son        Anxiety  . Clotting disorder Paternal Grandmother   . Clotting disorder Paternal Grandfather     Social History Social History   Tobacco Use  . Smoking status:  Never Smoker  . Smokeless tobacco: Current User    Types: Chew  Substance Use Topics  . Alcohol use: Yes    Comment: On occasion  . Drug use: Not Currently    Types: Marijuana     Allergies   Spiriva handihaler [tiotropium bromide monohydrate]   Review of Systems Review of Systems  Constitutional: Negative.   Neurological: Positive for headaches.  Hematological: Positive for adenopathy.   Physical Exam Triage Vital Signs ED Triage Vitals  Enc Vitals Group     BP 08/19/18 1929 (!) 127/94     Pulse Rate 08/19/18 1929 85     Resp 08/19/18 1929 18     Temp 08/19/18 1929 98.4 F (36.9 C)     Temp Source 08/19/18 1929 Oral     SpO2 08/19/18 1929 99 %     Weight 08/19/18 1926 185 lb (83.9 kg)     Height 08/19/18 1926 6\' 2"  (1.88 m)     Head Circumference --      Peak Flow --      Pain Score 08/19/18 1926 0     Pain Loc --      Pain Edu? --      Excl. in [place]? --    Updated Vital Signs  BP (!) 127/94 (BP Location: Right Arm)   Pulse 85   Temp 98.4 F (36.9 C) (Oral)   Resp 18   Ht 6\' 2"  (1.88 m)   Wt 83.9 kg   SpO2 99%   BMI 23.75 kg/m   Visual Acuity Right Eye Distance:   Left Eye Distance:   Bilateral Distance:    Right Eye Near:   Left Eye Near:    Bilateral Near:     Physical Exam Vitals signs and nursing note reviewed.  Constitutional:      General: He is not in acute distress.    Appearance: Normal appearance.  HENT:     Head: Normocephalic and atraumatic.     Right Ear: Tympanic membrane normal.     Left Ear: Tympanic membrane normal.     Mouth/Throat:     Pharynx: Oropharynx is clear. No posterior oropharyngeal erythema.  Eyes:     General:        Right eye: No discharge.        Left eye: No discharge.     Conjunctiva/sclera: Conjunctivae normal.  Neck:     Comments: Firm enlarged lymph node noted in the right anterior cervical region.  Small, pea-sized lymph node noted in the posterior regular region on the right. Cardiovascular:     Rate  and Rhythm: Normal rate and regular rhythm.  Pulmonary:     Effort: Pulmonary effort is normal.     Breath sounds: Normal breath sounds.  Neurological:     Mental Status: He is alert.  Psychiatric:        Mood and Affect: Mood normal.        Behavior: Behavior normal.    UC Treatments / Results  Labs (all labs ordered are listed, but only abnormal results are displayed) Labs Reviewed  CBC WITH DIFFERENTIAL/PLATELET    EKG None  Radiology No results found.  Procedures Procedures (including critical care time)  Medications Ordered in UC Medications - No data to display  Initial Impression / Assessment and Plan / UC Course  I have reviewed the triage vital signs and the nursing notes.  Pertinent labs & imaging results that were available during my care of the patient were reviewed by me and considered in my medical decision making (see chart for details).    38 year old male presents with lymphadenopathy.  CBC normal.  Advised close monitoring.  If continues to persist, will need to see ENT for aspiration or biopsy.  Final Clinical Impressions(s) / UC Diagnoses   Final diagnoses:  Cervical lymphadenopathy  Posterior auricular lymphadenopathy     Discharge Instructions     Keep an eye.  Labs normal.  If persists call ENT for an appt.  Take care  Dr. Adriana Simas    ED Prescriptions    None     Controlled Substance Prescriptions [place] Controlled Substance Registry consulted? Not Applicable   Tommie Sams, DO 08/19/18 2125

## 2018-08-19 NOTE — ED Triage Notes (Signed)
Patient c/o swollen lymph nodes on the right side of his neck x 1 week.

## 2018-08-19 NOTE — Discharge Instructions (Signed)
Keep an eye.  Labs normal.  If persists call ENT for an appt.  Take care  Dr. Adriana Simas

## 2018-09-05 ENCOUNTER — Ambulatory Visit: Payer: Self-pay | Admitting: Family Medicine

## 2019-05-25 ENCOUNTER — Encounter: Payer: Self-pay | Admitting: Emergency Medicine

## 2019-05-25 ENCOUNTER — Other Ambulatory Visit: Payer: Self-pay

## 2019-05-25 ENCOUNTER — Emergency Department
Admission: EM | Admit: 2019-05-25 | Discharge: 2019-05-25 | Disposition: A | Discharge status: Home / Self Care | Payer: BC Managed Care – PPO | Attending: Emergency Medicine | Admitting: Emergency Medicine

## 2019-05-25 ENCOUNTER — Emergency Department: Payer: BC Managed Care – PPO

## 2019-05-25 DIAGNOSIS — M79662 Pain in left lower leg: Secondary | ICD-10-CM

## 2019-05-25 DIAGNOSIS — F17220 Nicotine dependence, chewing tobacco, uncomplicated: Secondary | ICD-10-CM | POA: Insufficient documentation

## 2019-05-25 DIAGNOSIS — Z79899 Other long term (current) drug therapy: Secondary | ICD-10-CM | POA: Insufficient documentation

## 2019-05-25 DIAGNOSIS — J449 Chronic obstructive pulmonary disease, unspecified: Secondary | ICD-10-CM | POA: Diagnosis not present

## 2019-05-25 LAB — CBC
HCT: 54.3 % — ABNORMAL HIGH (ref 39.0–52.0)
Hemoglobin: 18.4 g/dL — ABNORMAL HIGH (ref 13.0–17.0)
MCH: 32.2 pg (ref 26.0–34.0)
MCHC: 33.9 g/dL (ref 30.0–36.0)
MCV: 94.9 fL (ref 80.0–100.0)
Platelets: 283 10*3/uL (ref 150–400)
RBC: 5.72 MIL/uL (ref 4.22–5.81)
RDW: 12.9 % (ref 11.5–15.5)
WBC: 9.3 10*3/uL (ref 4.0–10.5)
nRBC: 0 % (ref 0.0–0.2)

## 2019-05-25 LAB — COMPREHENSIVE METABOLIC PANEL
ALT: 31 U/L (ref 0–44)
AST: 30 U/L (ref 15–41)
Albumin: 4.4 g/dL (ref 3.5–5.0)
Alkaline Phosphatase: 54 U/L (ref 38–126)
Anion gap: 11 (ref 5–15)
BUN: 8 mg/dL (ref 6–20)
CO2: 25 mmol/L (ref 22–32)
Calcium: 9.6 mg/dL (ref 8.9–10.3)
Chloride: 102 mmol/L (ref 98–111)
Creatinine, Ser: 0.78 mg/dL (ref 0.61–1.24)
GFR calc Af Amer: >60 mL/min (ref >60)
GFR calc non Af Amer: >60 mL/min (ref >60)
Glucose, Bld: 89 mg/dL (ref 70–99)
Potassium: 3.9 mmol/L (ref 3.5–5.1)
Sodium: 138 mmol/L (ref 135–145)
Total Bilirubin: 2 mg/dL — ABNORMAL HIGH (ref 0.3–1.2)
Total Protein: 7.3 g/dL (ref 6.5–8.1)

## 2019-05-25 NOTE — ED Notes (Signed)
Se triage note  Presents with pain to left calf area  Denies any injury  Hx of blood clots  Feels similar

## 2019-05-25 NOTE — ED Provider Notes (Signed)
[facility] Emergency Department Provider Note   ____________________________________________    I have reviewed the triage vital signs and the nursing notes.   HISTORY  Chief Complaint Leg Pain and Possible blood clot     HPI Matthew Graves is a 38 y.o. male with a history of multiple DVTs in the past who is not currently on anticoagulation presents with complaints of pain in the back of his left calf.  He reports this developed over the last 24 hours.  He has been on both Xarelto and Eliquis in the past but is noncompliant, recently reports he has taken some baby aspirin.  Denies chest pain pleurisy or shortness of breath.  Has no other complaints at this time  Past Medical History:  Diagnosis Date  . Anxiety   . COPD (chronic obstructive pulmonary disease) (HCC)   . DVT (deep venous thrombosis) (HCC)    multiple, in both upper and lower extremities  . Pulmonary embolism [facility])     Patient Active Problem List   Diagnosis Date Noted  . 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 History of DVT (deep vein thrombosis) 05/28/2017  . History of pulmonary embolism 05/28/2017  . COPD (chronic obstructive pulmonary disease) (HCC)   . Anxiety   . DVT (deep venous thrombosis) (HCC)     Past Surgical History:  Procedure Laterality Date  . FRACTURE SURGERY     pins in fingers    Prior to Admission medications   Medication Sig Start Date End Date Taking? Authorizing Provider  ALPRAZolam Prudy Feeler) 1 MG tablet TAKE ONE TABLET BY MOUTH 3 TIMES A DAY 05/01/17   [provider]     Allergies Spiriva handihaler [tiotropium bromide monohydrate]  Family History  Problem Relation Age of Onset  . Rheum arthritis Mother   . Mental illness Mother        Anxiety  . COPD Father   . Mental illness Sister        Anxiety  . Mental illness Son        Anxiety  . Clotting disorder Paternal Grandmother   . Clotting disorder Paternal Grandfather      Social History Social History   Tobacco Use  . Smoking status: Never Smoker  . Smokeless tobacco: Current User    Types: Chew  Substance Use Topics  . Alcohol use: Yes    Comment: On occasion  . Drug use: Not Currently    Types: Marijuana    Review of Systems  Constitutional: No fever/chills Eyes: No visual changes.  ENT: No sore throat. Cardiovascular: As above Respiratory: No shortness of breath, no pleurisy Gastrointestinal: No abdominal pain.  No nausea, no vomiting.   Genitourinary: Negative for dysuria. Musculoskeletal: As above Skin: Negative for rash. Neurological: Negative for headaches or weakness   ____________________________________________   PHYSICAL EXAM:  VITAL SIGNS: ED Triage Vitals  Enc Vitals Group     BP 05/25/19 1723 120/71     Pulse Rate 05/25/19 1723 100     Resp 05/25/19 1723 18     Temp 05/25/19 1723 98.3 F (36.8 C)     Temp Source 05/25/19 1723 Oral     SpO2 05/25/19 1723 97 %     Weight 05/25/19 1725 86.2 kg (190 lb)     Height 05/25/19 1725 1.88 m (6\' 2" )     Head Circumference --      Peak Flow --      Pain Score 05/25/19  1725 2     Pain Loc --      Pain Edu? --      Excl. in [place]? --     Constitutional: Alert and oriented.   Nose: No congestion/rhinnorhea. Mouth/Throat: Mucous membranes are moist.    Cardiovascular: Normal rate, regular rhythm.   Good peripheral circulation. Respiratory: Normal respiratory effort.  No retractions Gastrointestinal: Soft and nontender. No distention.    Musculoskeletal: No semiswelling to the left leg, mild tenderness posterior calf centrally, warm and well perfused Neurologic:  Normal speech and language. No gross focal neurologic deficits are appreciated.  Skin:  Skin is warm, dry and intact. No rash noted. Psychiatric: Mood and affect are normal. Speech and behavior are normal.  ____________________________________________   LABS (all labs ordered are listed, but only abnormal  results are displayed)  Labs Reviewed  CBC - Abnormal; Notable for the following components:      Result Value   Hemoglobin 18.4 (*)    HCT 54.3 (*)    All other components within normal limits  COMPREHENSIVE METABOLIC PANEL - Abnormal; Notable for the following components:   Total Bilirubin 2.0 (*)    All other components within normal limits   ____________________________________________  EKG  None ____________________________________________  RADIOLOGY  Ultrasound ____________________________________________   PROCEDURES  Procedure(s) performed: No  Procedures   Critical Care performed: No ____________________________________________   INITIAL IMPRESSION / ASSESSMENT AND PLAN / ED COURSE  Pertinent labs & imaging results that were available during my care of the patient were reviewed by me and considered in my medical decision making (see chart for details).  Patient presents with left calf pain in the setting of history of DVT, will obtain ultrasound, pending labs.   Ultrasound negative for DVT, lab work is overall unremarkable.  Recommended repeat ultrasound in 7 to 10 days if continued symptoms    ____________________________________________   FINAL CLINICAL IMPRESSION(S) / ED DIAGNOSES  Final diagnoses:  Pain of left calf        Note:  This document was prepared using Dragon voice recognition software and may include unintentional dictation errors.   Lavonia Drafts, MD 05/25/19 [phone]

## 2019-05-25 NOTE — ED Triage Notes (Signed)
Pt states pain to left calf, has hx of blood clots and feel this is the same. NAD.

## 2019-05-25 NOTE — Discharge Instructions (Addendum)
No evidence of a DVT today on ultrasound given your history it may be necessary to have a repeat ultrasound in 1 to 2 weeks if the pain continues

## 2019-11-08 ENCOUNTER — Emergency Department
Admission: EM | Admit: 2019-11-08 | Discharge: 2019-11-08 | Disposition: A | Discharge status: Home / Self Care | Payer: Self-pay | Attending: Emergency Medicine | Admitting: Emergency Medicine

## 2019-11-08 ENCOUNTER — Emergency Department: Payer: BC Managed Care – PPO

## 2019-11-08 ENCOUNTER — Other Ambulatory Visit: Payer: Self-pay

## 2019-11-08 DIAGNOSIS — J449 Chronic obstructive pulmonary disease, unspecified: Secondary | ICD-10-CM | POA: Insufficient documentation

## 2019-11-08 DIAGNOSIS — T07XXXA Unspecified multiple injuries, initial encounter: Secondary | ICD-10-CM

## 2019-11-08 DIAGNOSIS — M79602 Pain in left arm: Secondary | ICD-10-CM

## 2019-11-08 DIAGNOSIS — Z79899 Other long term (current) drug therapy: Secondary | ICD-10-CM | POA: Insufficient documentation

## 2019-11-08 DIAGNOSIS — Y93I9 Activity, other involving external motion: Secondary | ICD-10-CM | POA: Insufficient documentation

## 2019-11-08 DIAGNOSIS — R519 Headache, unspecified: Secondary | ICD-10-CM | POA: Insufficient documentation

## 2019-11-08 DIAGNOSIS — Y929 Unspecified place or not applicable: Secondary | ICD-10-CM | POA: Insufficient documentation

## 2019-11-08 DIAGNOSIS — Y999 Unspecified external cause status: Secondary | ICD-10-CM | POA: Insufficient documentation

## 2019-11-08 DIAGNOSIS — S50312A Abrasion of left elbow, initial encounter: Secondary | ICD-10-CM | POA: Insufficient documentation

## 2019-11-08 LAB — CBC
HCT: 48.9 % (ref 39.0–52.0)
Hemoglobin: 17.2 g/dL — ABNORMAL HIGH (ref 13.0–17.0)
MCH: 32.6 pg (ref 26.0–34.0)
MCHC: 35.2 g/dL (ref 30.0–36.0)
MCV: 92.6 fL (ref 80.0–100.0)
Platelets: 277 10*3/uL (ref 150–400)
RBC: 5.28 MIL/uL (ref 4.22–5.81)
RDW: 12.7 % (ref 11.5–15.5)
WBC: 12.2 10*3/uL — ABNORMAL HIGH (ref 4.0–10.5)
nRBC: 0 % (ref 0.0–0.2)

## 2019-11-08 LAB — COMPREHENSIVE METABOLIC PANEL
ALT: 26 U/L (ref 0–44)
AST: 25 U/L (ref 15–41)
Albumin: 4.3 g/dL (ref 3.5–5.0)
Alkaline Phosphatase: 48 U/L (ref 38–126)
Anion gap: 8 (ref 5–15)
BUN: 7 mg/dL (ref 6–20)
CO2: 28 mmol/L (ref 22–32)
Calcium: 8.7 mg/dL — ABNORMAL LOW (ref 8.9–10.3)
Chloride: 108 mmol/L (ref 98–111)
Creatinine, Ser: 0.83 mg/dL (ref 0.61–1.24)
GFR calc Af Amer: >60 mL/min (ref >60)
GFR calc non Af Amer: >60 mL/min (ref >60)
Glucose, Bld: 111 mg/dL — ABNORMAL HIGH (ref 70–99)
Potassium: 3.7 mmol/L (ref 3.5–5.1)
Sodium: 144 mmol/L (ref 135–145)
Total Bilirubin: 0.8 mg/dL (ref 0.3–1.2)
Total Protein: 7.2 g/dL (ref 6.5–8.1)

## 2019-11-08 LAB — ETHANOL: Alcohol, Ethyl (B): 266 mg/dL — ABNORMAL HIGH (ref <10)

## 2019-11-08 MED ORDER — OXYCODONE-ACETAMINOPHEN 7.5-325 MG PO TABS: 1.0000 | ORAL_TABLET | ORAL | 0 refills | Status: DC | PRN

## 2019-11-08 MED ORDER — MORPHINE SULFATE (PF) 4 MG/ML IV SOLN
4.0000 mg | Freq: Once | INTRAVENOUS | Status: AC
  Administered 2019-11-08: 4 mg via INTRAVENOUS
  Filled 2019-11-08: qty 1

## 2019-11-08 MED ORDER — KETOROLAC TROMETHAMINE 30 MG/ML IJ SOLN
30.0000 mg | Freq: Once | INTRAMUSCULAR | Status: AC
  Administered 2019-11-08: 30 mg via INTRAVENOUS
  Filled 2019-11-08: qty 1

## 2019-11-08 MED ORDER — ONDANSETRON HCL 4 MG/2ML IJ SOLN
4.0000 mg | Freq: Once | INTRAMUSCULAR | Status: AC
  Administered 2019-11-08: 4 mg via INTRAVENOUS
  Filled 2019-11-08: qty 2

## 2019-11-08 MED ORDER — BACITRACIN ZINC 500 UNIT/GM EX OINT
TOPICAL_OINTMENT | Freq: Once | CUTANEOUS | Status: AC
  Administered 2019-11-08: 1 via TOPICAL
  Filled 2019-11-08: qty 0.9
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
## 2020-10-06 ENCOUNTER — Emergency Department: Payer: 59

## 2020-10-06 ENCOUNTER — Other Ambulatory Visit: Payer: Self-pay

## 2020-10-06 ENCOUNTER — Emergency Department
Admission: EM | Admit: 2020-10-06 | Discharge: 2020-10-06 | Disposition: A | Discharge status: Home / Self Care | Payer: 59 | Attending: Emergency Medicine | Admitting: Emergency Medicine

## 2020-10-06 DIAGNOSIS — W230XXA Caught, crushed, jammed, or pinched between moving objects, initial encounter: Secondary | ICD-10-CM | POA: Insufficient documentation

## 2020-10-06 DIAGNOSIS — Z23 Encounter for immunization: Secondary | ICD-10-CM | POA: Insufficient documentation

## 2020-10-06 DIAGNOSIS — Y990 Civilian activity done for income or pay: Secondary | ICD-10-CM | POA: Diagnosis not present

## 2020-10-06 DIAGNOSIS — J449 Chronic obstructive pulmonary disease, unspecified: Secondary | ICD-10-CM | POA: Insufficient documentation

## 2020-10-06 DIAGNOSIS — S6992XA Unspecified injury of left wrist, hand and finger(s), initial encounter: Secondary | ICD-10-CM | POA: Diagnosis present

## 2020-10-06 DIAGNOSIS — S61316A Laceration without foreign body of right little finger with damage to nail, initial encounter: Secondary | ICD-10-CM | POA: Diagnosis not present

## 2020-10-06 DIAGNOSIS — F17220 Nicotine dependence, chewing tobacco, uncomplicated: Secondary | ICD-10-CM | POA: Insufficient documentation

## 2020-10-06 DIAGNOSIS — T148XXA Other injury of unspecified body region, initial encounter: Secondary | ICD-10-CM

## 2020-10-06 MED ORDER — NAPROXEN 500 MG PO TABS
500.0000 mg | ORAL_TABLET | Freq: Once | ORAL | Status: AC
  Administered 2020-10-06: 500 mg via ORAL
  Filled 2020-10-06: qty 1

## 2020-10-06 MED ORDER — METHOCARBAMOL 500 MG PO TABS
500.0000 mg | ORAL_TABLET | Freq: Once | ORAL | Status: AC
  Administered 2020-10-06: 500 mg via ORAL
  Filled 2020-10-06: qty 1

## 2020-10-06 MED ORDER — CEPHALEXIN 500 MG PO CAPS: 500.0000 mg | ORAL_CAPSULE | Freq: Three times a day (TID) | ORAL | 0 refills | Status: AC

## 2020-10-06 MED ORDER — OXYCODONE HCL 5 MG PO TABS: 5.0000 mg | ORAL_TABLET | ORAL | 0 refills | Status: AC | PRN

## 2020-10-06 MED ORDER — CEPHALEXIN 500 MG PO CAPS
500.0000 mg | ORAL_CAPSULE | Freq: Once | ORAL | Status: AC
  Administered 2020-10-06: 500 mg via ORAL
  Filled 2020-10-06: qty 1

## 2020-10-06 MED ORDER — OXYCODONE HCL 5 MG PO TABS
5.0000 mg | ORAL_TABLET | Freq: Once | ORAL | Status: AC
  Administered 2020-10-06: 5 mg via ORAL
  Filled 2020-10-06: qty 1

## 2020-10-06 MED ORDER — METHOCARBAMOL 500 MG PO TABS: 500.0000 mg | ORAL_TABLET | Freq: Four times a day (QID) | ORAL | 0 refills | Status: DC | PRN

## 2020-10-06 MED ORDER — TETANUS-DIPHTH-ACELL PERTUSSIS 5-2.5-18.5 LF-MCG/0.5 IM SUSY
0.5000 mL | PREFILLED_SYRINGE | Freq: Once | INTRAMUSCULAR | Status: AC
  Administered 2020-10-06: 0.5 mL via INTRAMUSCULAR
  Filled 2020-10-06: qty 0.5

## 2020-10-06 MED ORDER — LIDOCAINE HCL (PF) 1 % IJ SOLN
5.0000 mL | Freq: Once | INTRAMUSCULAR | Status: AC
  Administered 2020-10-06: 5 mL
  Filled 2020-10-06: qty 5

## 2020-10-06 MED ORDER — ACETAMINOPHEN 500 MG PO TABS
1000.0000 mg | ORAL_TABLET | Freq: Once | ORAL | Status: AC
  Administered 2020-10-06: 1000 mg via ORAL
  Filled 2020-10-06: qty 2

## 2020-10-06 NOTE — ED Notes (Signed)
Rt fifth digit wrapped with Xeroform petrolatum dressing and gauze per D. Katrinka Blazing MD.

## 2020-10-06 NOTE — Discharge Instructions (Addendum)
Please take Tylenol and ibuprofen/Advil for your pain.  It is safe to take them together, or to alternate them every few hours.  Take up to 1000mg  of Tylenol at a time, up to 4 times per day.  Do not take more than 4000 mg of Tylenol in 24 hours.  For ibuprofen, take 400-600 mg, 4-5 times per day.  You are being discharged with Robaxin muscle relaxer to use up to 4 times daily for moderate to severe pain. You are being discharged with Oxycodone opiate pain medicine to use for severe/breakthrough pain every 4-6 hours as needed  Use all of these medicines in combination.  It is safe to take them altogether, but do not drive or operate machinery while taking Robaxin or oxycodone.  Keflex antibiotic to take 3 times daily for the next week or so to prevent infection.  Keep this wound bandaged and follow-up with the hand surgeons in the next 3-5 days.  Call them first thing in the morning to schedule an appointment for either Saturday or Monday for close follow-up.  If you develop any uncontrolled pain, fevers or pus coming from the bandages, please return to ED immediately.

## 2020-10-06 NOTE — ED Provider Notes (Signed)
[facility] Emergency Department Provider Note ____________________________________________   Event Date/Time   First MD Initiated Contact with Patient 10/06/20 1832     (approximate)  I have reviewed the triage vital signs and the nursing notes.  HISTORY  Chief Complaint Finger Injury   HPI Matthew Graves is a 40 y.o. malewho presents to the ED for evaluation of crush injury to his right fifth finger  Chart review indicates history of severe social anxiety on thrice daily 1 mg alprazolam for greater than a decade.  Last taken 90 minutes ago.  Patient presents to the ED after accidental work-related injury.  He is right-hand dominant.  He reports a crush injury to his right fifth finger that happened about 20 minutes prior to arrival.  Reports 10/10 pain.  Denies head injury, falls or trauma, syncopal episodes or injuries beyond his right fifth finger.  He is uncertain if this includes his fourth finger as well.   Past Medical History:  Diagnosis Date  . Anxiety   . COPD (chronic obstructive pulmonary disease) (HCC)   . DVT (deep venous thrombosis) (HCC)    multiple, in both upper and lower extremities  . Pulmonary embolism [facility](HCC)     Patient Active Problem List   Diagnosis Date Noted  . 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 History of DVT (deep vein thrombosis) 05/28/2017  . History of pulmonary embolism 05/28/2017  . COPD (chronic obstructive pulmonary disease) (HCC)   . Anxiety   . DVT (deep venous thrombosis) (HCC)     Past Surgical History:  Procedure Laterality Date  . FRACTURE SURGERY     pins in fingers    Prior to Admission medications   Medication Sig Start Date End Date Taking? Authorizing Provider  cephALEXin (KEFLEX) 500 MG capsule Take 1 capsule (500 mg total) by mouth 3 (three) times daily for 10 days. 10/06/20 10/16/20 Yes Delton PrairieSmith, Indy Prestwood, MD  methocarbamol (ROBAXIN) 500 MG tablet Take 1 tablet (500 mg total) by mouth  every 6 (six) hours as needed for muscle spasms (moderate/severe pain). 10/06/20  Yes Delton PrairieSmith, Tiffony Kite, MD  oxyCODONE (ROXICODONE) 5 MG immediate release tablet Take 1 tablet (5 mg total) by mouth every 4 (four) hours as needed for severe pain or breakthrough pain. 10/06/20 10/06/21 Yes Delton PrairieSmith, Jaylenn Baiza, MD  ALPRAZolam Prudy Feeler(XANAX) 1 MG tablet TAKE ONE TABLET BY MOUTH 3 TIMES A DAY 05/01/17   [provider]    Allergies Spiriva handihaler [tiotropium bromide monohydrate]  Family History  Problem Relation Age of Onset  . Rheum arthritis Mother   . Mental illness Mother        Anxiety  . COPD Father   . Mental illness Sister        Anxiety  . Mental illness Son        Anxiety  . Clotting disorder Paternal Grandmother   . Clotting disorder Paternal Grandfather     Social History Social History   Tobacco Use  . Smoking status: Never Smoker  . Smokeless tobacco: Current User    Types: Chew  Vaping Use  . Vaping Use: Never used  Substance Use Topics  . Alcohol use: Yes    Comment: On occasion  . Drug use: Not Currently    Types: Marijuana    Review of Systems  Constitutional: No fever/chills Eyes: No visual changes. ENT: No sore throat. Cardiovascular: Denies chest pain. Respiratory: Denies shortness of breath. Gastrointestinal: No abdominal pain.  No nausea, no vomiting.  No diarrhea.  No constipation. Genitourinary: Negative for dysuria. Musculoskeletal: Negative for back pain.  Right fifth finger crush injury Skin: Negative for rash. Neurological: Negative for headaches, focal weakness or numbness.  ____________________________________________   PHYSICAL EXAM:  VITAL SIGNS: Vitals:   10/06/20 1839  BP: 121/88  Pulse: 84  Resp: 16  Temp: 98.4 F (36.9 C)  SpO2: 99%     Constitutional: Alert and oriented. Well appearing and in no acute distress. Eyes: Conjunctivae are normal. PERRL. EOMI. Head: Atraumatic. Nose: No congestion/rhinnorhea. Mouth/Throat: Mucous  membranes are moist.  Oropharynx non-erythematous. Neck: No stridor. No cervical spine tenderness to palpation. Cardiovascular: Normal rate, regular rhythm. Grossly normal heart sounds.  Good peripheral circulation. Respiratory: Normal respiratory effort.  No retractions. Lungs CTAB. Gastrointestinal: Soft , nondistended, nontender to palpation. No CVA tenderness. Musculoskeletal: No lower extremity tenderness nor edema.  No joint effusions.   Isolated injury to his right fifth digit, as pictured below.  Obliquely oriented laceration that primarily involve the distal phalanx of this digit.  Laceration is through the nailbed.  The affected finger nail is hanging loosely and is easy to remove with gentle traction.  It removes intact.  No other signs of trauma beyond his right fifth digit.  Neurologic:  Normal speech and language. No gross focal neurologic deficits are appreciated. No gait instability noted. Skin:  Skin is warm, dry and intact. No rash noted. Psychiatric: Mood and affect are normal. Speech and behavior are normal.       ____________________________________________   LABS (all labs ordered are listed, but only abnormal results are displayed)  Labs Reviewed - No data to display  ____________________________________________  RADIOLOGY  ED MD interpretation: Plain film of the right hand reviewed by me with fifth digit fracture of the distal phalanx  Official radiology report(s): DG Hand 2 View Right  Result Date: 10/06/2020 CLINICAL DATA:  Status post trauma. EXAM: RIGHT HAND - 2 VIEW COMPARISON:  Nov 23, 2007 FINDINGS: An acute fracture deformity is seen involving the distal aspect of the distal phalanx of the fifth right finger. A chronic fracture deformity of the fifth right metacarpal is seen. There is no evidence of dislocation. A soft tissue defect is seen involving the distal tip of the fifth right finger. IMPRESSION: Acute fracture of the distal phalanx of the  fifth right finger. Electronically Signed   By: Aram Candela M.D.   On: 10/06/2020 19:12    ____________________________________________   PROCEDURES and INTERVENTIONS  Procedure(s) performed (including Critical Care):  .Nerve Block  Date/Time: 10/06/2020 7:25 PM Performed by: Delton Prairie, MD Authorized by: Delton Prairie, MD   Consent:    Consent obtained:  Verbal   Consent given by:  Patient   Risks, benefits, and alternatives were discussed: yes     Risks discussed:  Allergic reaction, bleeding, swelling, unsuccessful block, pain and infection Indications:    Indications:  Pain relief and procedural anesthesia Location:    Body area:  Upper extremity   Laterality:  Right Pre-procedure details:    Skin preparation:  Chlorhexidine Procedure details:    Block needle gauge:  25 G   Anesthetic injected:  Lidocaine 1% w/o epi Post-procedure details:    Outcome:  Anesthesia achieved   Procedure completion:  Tolerated well, no immediate complications Comments:     Digital block of the right fifth finger performed by me.  2 injection sites, medially and laterally, each receiving about 2 cc of plain lidocaine without epinephrine.  Well-tolerated.  Successful  anesthesia achieved.  .Nail Removal  Date/Time: 10/06/2020 8:22 PM Performed by: Delton Prairie, MD Authorized by: Delton Prairie, MD   Consent:    Consent obtained:  Verbal   Consent given by:  Patient   Risks, benefits, and alternatives were discussed: yes     Risks discussed:  Bleeding and permanent nail deformity Location:    Hand:  R small finger Anesthesia:    Anesthesia method: Digital block. Nail Removal:    Nail removed:  Complete   Nail bed repaired: no     Removed nail replaced and anchored: no   .Marland KitchenLaceration Repair  Date/Time: 10/06/2020 8:23 PM Performed by: Delton Prairie, MD Authorized by: Delton Prairie, MD   Consent:    Consent obtained:  Verbal   Consent given by:  Patient   Risks, benefits,  and alternatives were discussed: yes     Risks discussed:  Infection, pain, need for additional repair, nerve damage, poor wound healing, poor cosmetic result, retained foreign body, tendon damage and vascular damage   Alternatives discussed:  No treatment Anesthesia:    Anesthesia method: Digital block is separately documented. Laceration details:    Location:  Finger   Finger location:  R small finger Exploration:    Hemostasis achieved with:  Direct pressure   Imaging obtained: x-ray     Imaging outcome: foreign body not noted     Wound exploration: wound explored through full range of motion     Contaminated: no   Treatment:    Area cleansed with:  Povidone-iodine   Amount of cleaning:  Extensive   Irrigation solution:  Sterile saline   Irrigation method:  Pressure wash   Visualized foreign bodies/material removed: no     Debridement:  Minimal Skin repair:    Repair method:  Sutures   Suture size:  4-0   Suture material:  Nylon   Suture technique:  Simple interrupted   Number of sutures:  9 Approximation:    Approximation:  Close Repair type:    Repair type:  Intermediate Post-procedure details:    Dressing:  Sterile dressing   Procedure completion:  Tolerated well, no immediate complications Comments:     Difficult to repair due to patient's loss of tissue.  After removing his nail, I was to reattach flap of nearly fully avulsed skin and tissue of his volar finger.  After attaching this, his dorsal anatomy was obscured that I was unable to replace his fingernail into the nail matrix to maintain this.  I discussed this with him, and he reports that this is okay and he does not care.    Medications  naproxen (NAPROSYN) tablet 500 mg (has no administration in time range)  oxyCODONE (Oxy IR/ROXICODONE) immediate release tablet 5 mg (has no administration in time range)  methocarbamol (ROBAXIN) tablet 500 mg (has no administration in time range)  Tdap (BOOSTRIX) injection  0.5 mL (0.5 mLs Intramuscular Given 10/06/20 1844)  cephALEXin (KEFLEX) capsule 500 mg (500 mg Oral Given 10/06/20 1844)  acetaminophen (TYLENOL) tablet 1,000 mg (1,000 mg Oral Given 10/06/20 1843)  oxyCODONE (Oxy IR/ROXICODONE) immediate release tablet 5 mg (5 mg Oral Given 10/06/20 1843)  lidocaine (PF) (XYLOCAINE) 1 % injection 5 mL (5 mLs Infiltration Given by Other 10/06/20 1912)    ____________________________________________   MDM / ED COURSE   Patient presents after accidental crush injury to his dominant/right small finger with underlying fracture, amenable to repair and outpatient follow-up.  Normal vitals.  Exam with isolated crush injury to  his right fifth finger, as pictured above.  Extensive injury with small underlying fracture, but with primarily soft tissue trauma.  Significant loss of tissue and near full loss of volar soft tissue to the pad of his distal phalanx.  I discussed the case with orthopedics on-call, who reviewed x-rays and images, recommends attempt at primary repair and close outpatient follow-up.  I try to primarily repair the soft tissue, and it certainly is improved, but I am unable to replace the nail to the nail matrix due to distorted anatomy.  Patient was started on antibiotic prophylaxis and provided Tdap.  We discussed multimodal pain control and outpatient follow-up with orthopedics.  We discussed return precautions for the ED.  Clinical Course as of 10/06/20 2024  Thu Oct 06, 2020  1906 Digital block performed. Well tolerated and left patient had an iodine soak with sterile saline [DS]  1922 Return to the bedside and remove patient's fingernail and acquire a couple pictures that are attached to his chart.  I am concerned that he has evulsed pretty much all the soft tissue of his distal phalanx and uncertain if it is viable.  I paged orthopedics [DS]  1932 I discussed the case with Dr. Joice Lofts, ortho on call.  He reviews x-ray images and media images that I have  uploaded.  He recommends attempting repair with Prolene sutures and having a follow-up closely as an outpatient with antibiotics [DS]  2011 Laceration repair complete with 9 simple interrupted sutures.  I discussed wound care, outpatient management, multimodal pain control and hand follow-up with the patient throughout this process.  Answered questions.  Follow-up with the nurse after this and provide recommendations for wound care and appropriate dressings to go home with [DS]    Clinical Course User Index [DS] Delton Prairie, MD    ____________________________________________   FINAL CLINICAL IMPRESSION(S) / ED DIAGNOSES  Final diagnoses:  Laceration of right little finger without foreign body with damage to nail, initial encounter  Crush injury  Work related injury     ED Discharge Orders         Ordered    oxyCODONE (ROXICODONE) 5 MG immediate release tablet  Every 4 hours PRN        10/06/20 2014    methocarbamol (ROBAXIN) 500 MG tablet  Every 6 hours PRN        10/06/20 2014    cephALEXin (KEFLEX) 500 MG capsule  3 times daily        10/06/20 2023           Matthew Graves   Note:  This document was prepared using Dragon voice recognition software and may include unintentional dictation errors.   Delton Prairie, MD 10/06/20 2028

## 2020-10-06 NOTE — ED Notes (Signed)
EDP already evaluated pt. Pt does not need to sign MSE waiver.

## 2020-10-06 NOTE — ED Triage Notes (Signed)
Pt to ED via POV (friend drove) with L finger injury with visual deformity from deep laceration, with about half of tissue from finger hanging off (L pinkie finger). Also L ring finger appears injured.  Pt ambulatory to room.

## 2020-11-21 IMAGING — US US EXTREM LOW VENOUS*L*
1 series · 13 of 24 positions shown · non-contrast
Comparison: 04/02/2017

CLINICAL DATA: Calf pain and swelling



[Series 1: us extrem low venous*left* · 13 of 37 slices shown]
[im 1/37]
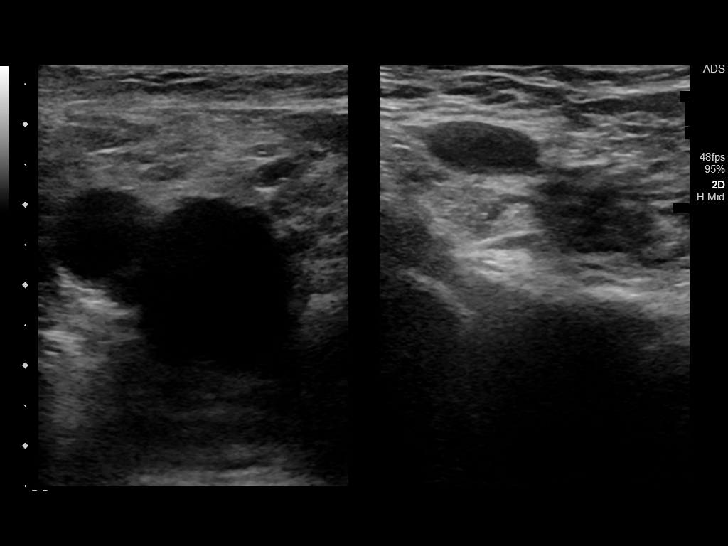
[im 4/37]
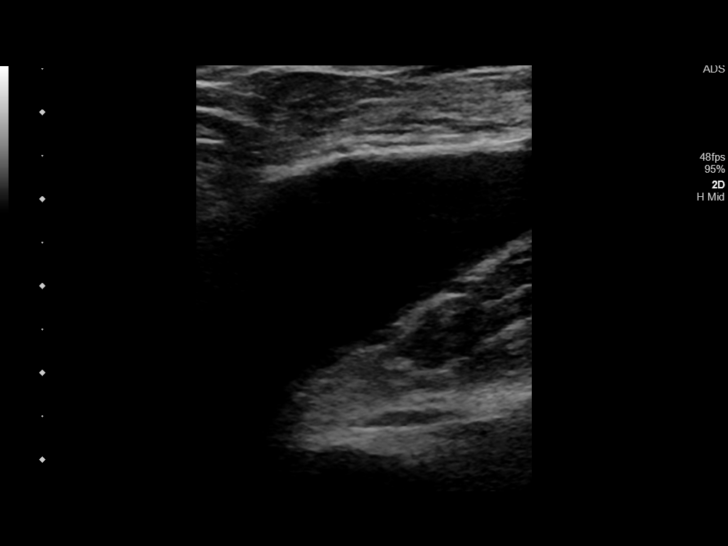
[im 7/37]
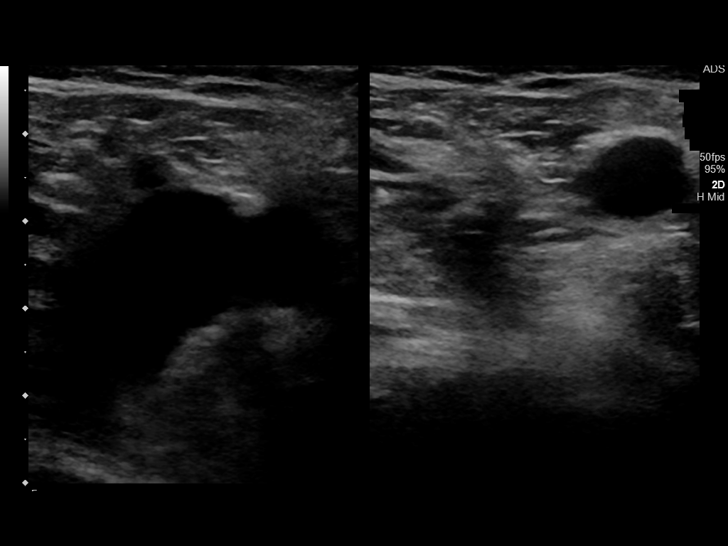
[im 10/37]
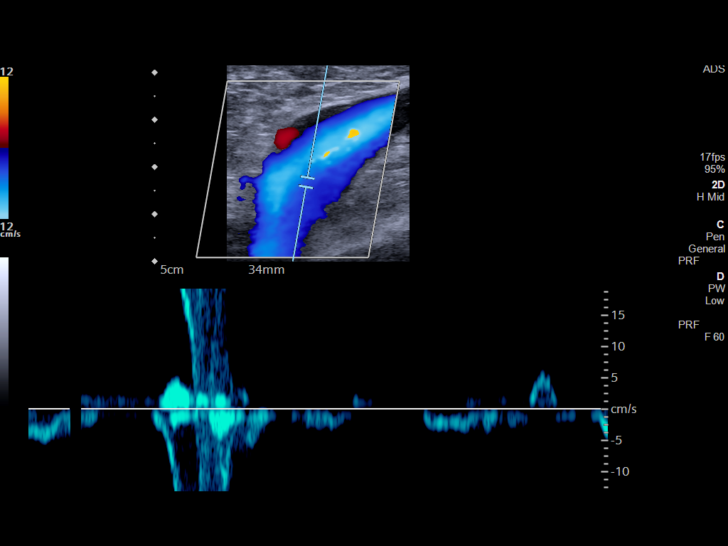
[im 13/37]
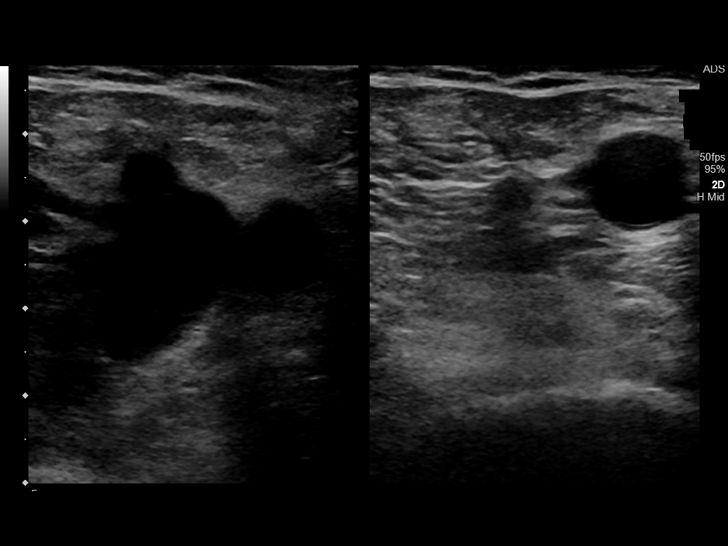
[im 16/37]
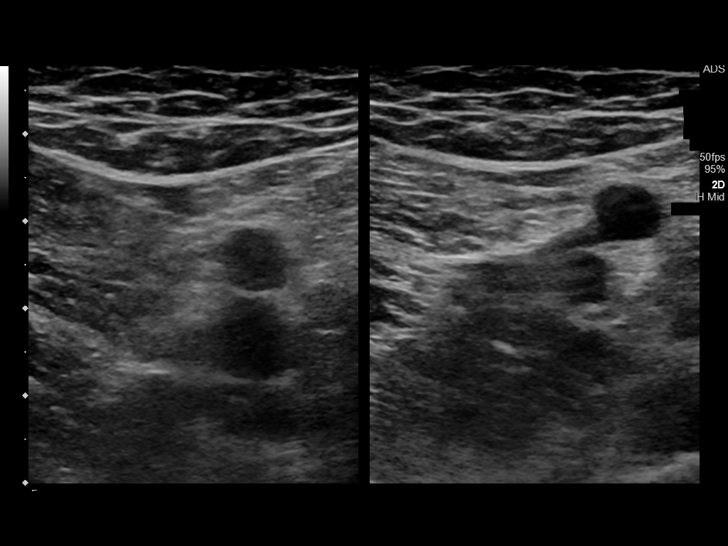
[im 19/37]
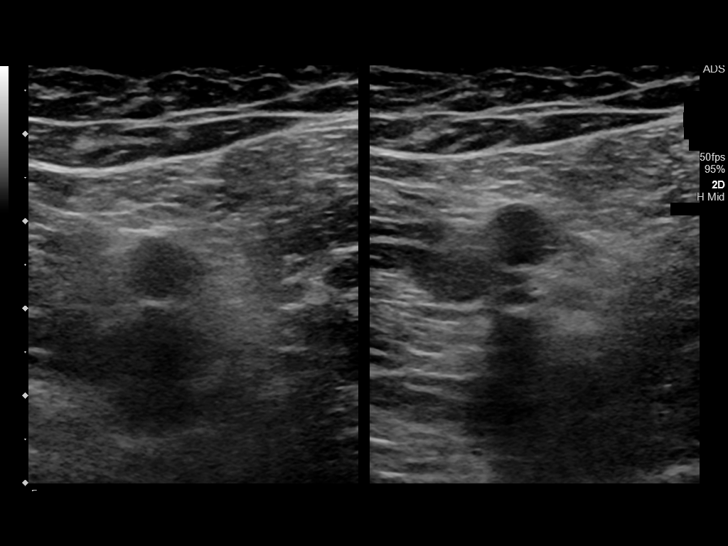
[im 21/37]
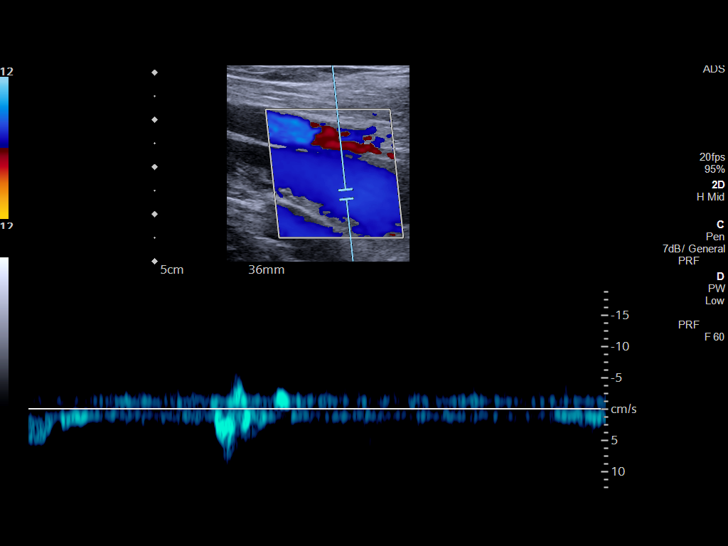
[im 24/37]
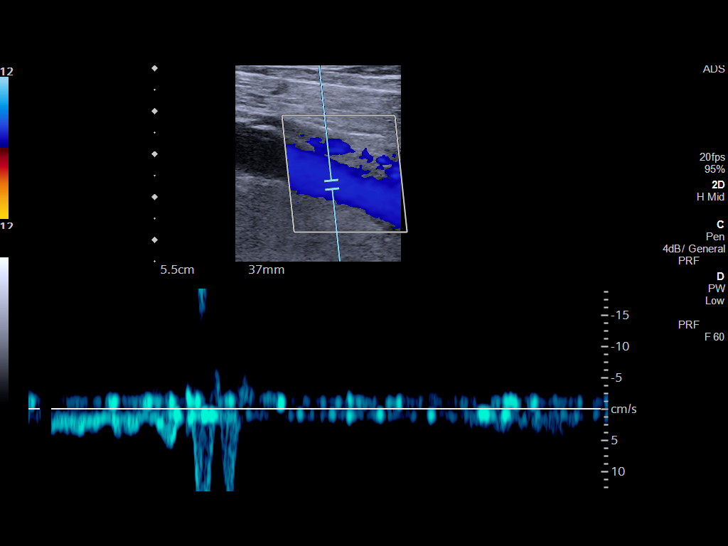
[im 27/37]
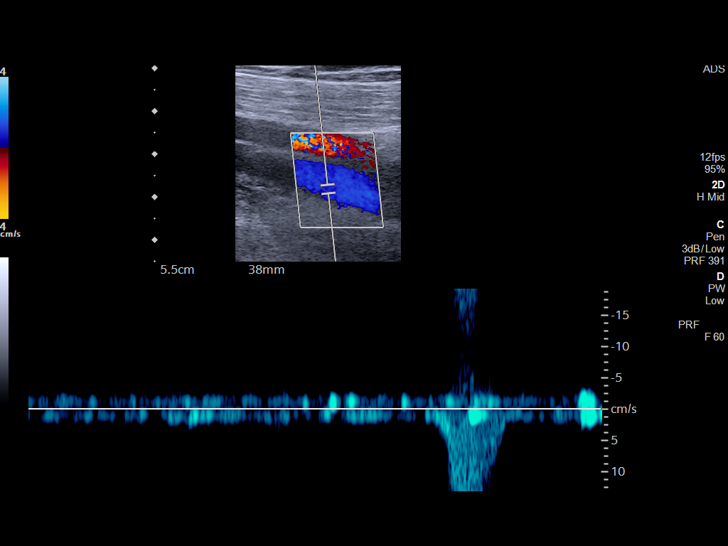
[im 30/37]
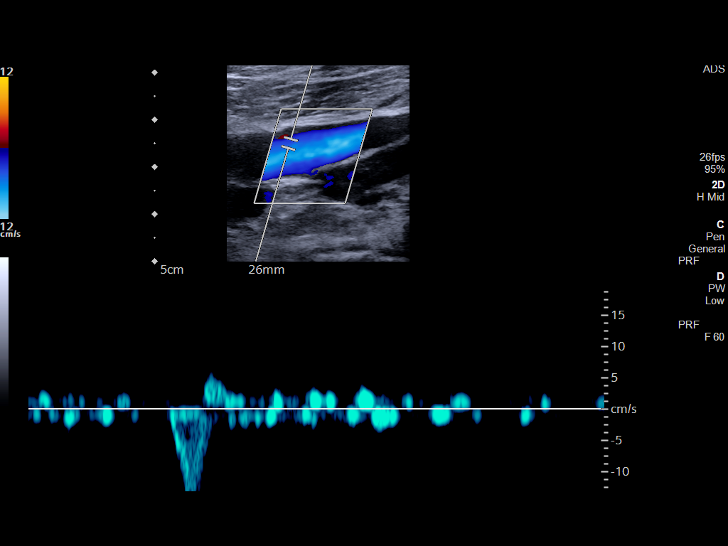
[im 33/37]
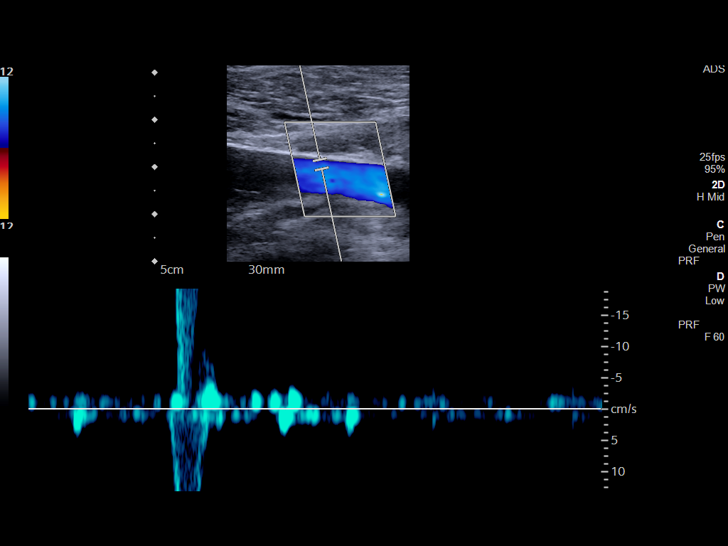
[im 37/37]
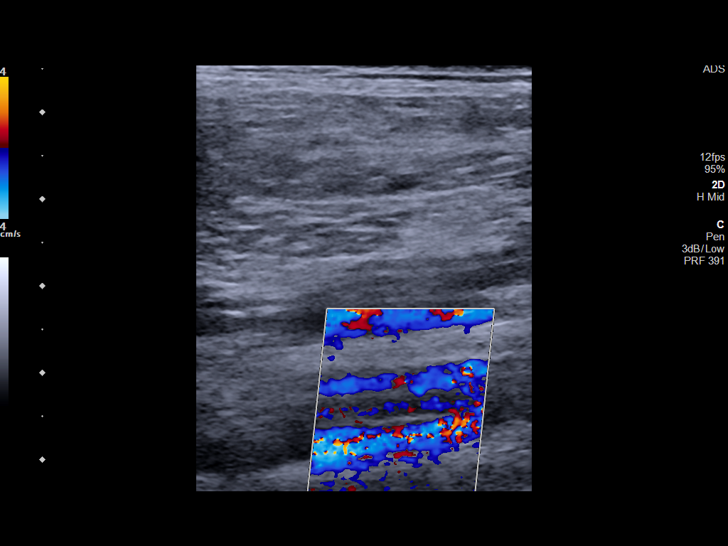

[13 of 24 positions shown; findings below may reference images not displayed]

FINDINGS: Contralateral Common Femoral Vein: Respiratory phasicity is normal
and symmetric with the symptomatic side. No evidence of thrombus.
Normal compressibility.

Common Femoral Vein: No evidence of thrombus. Normal
compressibility, respiratory phasicity and response to augmentation.

Saphenofemoral Junction: No evidence of thrombus. Normal
compressibility and flow on color Doppler imaging.

Profunda Femoral Vein: No evidence of thrombus. Normal
compressibility and flow on color Doppler imaging.

Femoral Vein: No evidence of thrombus. Normal compressibility,
respiratory phasicity and response to augmentation.

Popliteal Vein: No evidence of thrombus. Normal compressibility,
respiratory phasicity and response to augmentation.

Calf Veins: No evidence of thrombus. Normal compressibility and flow
on color Doppler imaging.
IMPRESSION: No evidence of deep venous thrombosis.

## 2021-02-21 ENCOUNTER — Ambulatory Visit: Payer: Self-pay

## 2021-02-21 NOTE — Telephone Encounter (Signed)
Patient called and says that he has been having some chest tightness off and on. He says that it started with his left upper arm, then to his left shoulder and shoulder blade, then his left back. He says he has a history of blood clots and wonders if this is the same thing. He says the chest tightness is mild. He says he has COPD and has mild SOB with that. He says on Thursday he was a lightheaded, but not today. I advised to go to the ED, care advice given, patient verbalized understanding.   Reason for Disposition  [1] Chest pain lasts > 5 minutes AND [2] occurred in past 3 days (72 hours) (Exception: feels exactly the same as previously diagnosed heartburn and has accompanying sour taste in mouth)  Answer Assessment - Initial Assessment Questions 1. LOCATION: "Where does it hurt?"       Chest is tight on the left side 2. RADIATION: "Does the pain go anywhere else?" (e.g., into neck, jaw, arms, back)     Left shoulder, left upper arm, left shoulder blade, left upper back 3. ONSET: "When did the chest pain begin?" (Minutes, hours or days)      Few weeks ago started in arm 4. PATTERN "Does the pain come and go, or has it been constant since it started?"  "Does it get worse with exertion?"      Come and go 5. DURATION: "How long does it last" (e.g., seconds, minutes, hours)     Sometimes hours 6. SEVERITY: "How bad is the pain?"  (e.g., Scale 1-10; mild, moderate, or severe)    - MILD (1-3): doesn't interfere with normal activities     - MODERATE (4-7): interferes with normal activities or awakens from sleep    - SEVERE (8-10): excruciating pain, unable to do any normal activities       Mild 7. CARDIAC RISK FACTORS: "Do you have any history of heart problems or risk factors for heart disease?" (e.g., angina, prior heart attack; diabetes, high blood pressure, high cholesterol, smoker, or strong family history of heart disease)     Vape a little 8. PULMONARY RISK FACTORS: "Do you have any  history of lung disease?"  (e.g., blood clots in lung, asthma, emphysema, birth control pills)     COPD, emphysema 9. CAUSE: "What do you think is causing the chest pain?"     I don't know 10. OTHER SYMPTOMS: "Do you have any other symptoms?" (e.g., dizziness, nausea, vomiting, sweating, fever, difficulty breathing, cough)      Mild difficulty breathing, lightheaded last Thursday 11. PREGNANCY: "Is there any chance you are pregnant?" "When was your last menstrual period?"       N/A  Protocols used: Chest Pain-A-AH

## 2021-02-22 ENCOUNTER — Encounter: Payer: Self-pay | Admitting: Radiology

## 2021-02-22 ENCOUNTER — Emergency Department: Payer: Self-pay

## 2021-02-22 ENCOUNTER — Other Ambulatory Visit: Payer: Self-pay

## 2021-02-22 ENCOUNTER — Emergency Department
Admission: EM | Admit: 2021-02-22 | Discharge: 2021-02-22 | Disposition: A | Discharge status: Home / Self Care | Payer: Self-pay | Attending: Student in an Organized Health Care Education/Training Program | Admitting: Student in an Organized Health Care Education/Training Program

## 2021-02-22 DIAGNOSIS — R0602 Shortness of breath: Secondary | ICD-10-CM | POA: Insufficient documentation

## 2021-02-22 DIAGNOSIS — F17220 Nicotine dependence, chewing tobacco, uncomplicated: Secondary | ICD-10-CM | POA: Insufficient documentation

## 2021-02-22 DIAGNOSIS — M25512 Pain in left shoulder: Secondary | ICD-10-CM | POA: Insufficient documentation

## 2021-02-22 DIAGNOSIS — J449 Chronic obstructive pulmonary disease, unspecified: Secondary | ICD-10-CM | POA: Insufficient documentation

## 2021-02-22 DIAGNOSIS — R0789 Other chest pain: Secondary | ICD-10-CM | POA: Insufficient documentation

## 2021-02-22 LAB — BASIC METABOLIC PANEL
Anion gap: 6 (ref 5–15)
BUN: 8 mg/dL (ref 6–20)
CO2: 31 mmol/L (ref 22–32)
Calcium: 9.1 mg/dL (ref 8.9–10.3)
Chloride: 104 mmol/L (ref 98–111)
Creatinine, Ser: 1.02 mg/dL (ref 0.61–1.24)
GFR, Estimated: >60 mL/min (ref >60)
Glucose, Bld: 133 mg/dL — ABNORMAL HIGH (ref 70–99)
Potassium: 3.9 mmol/L (ref 3.5–5.1)
Sodium: 141 mmol/L (ref 135–145)

## 2021-02-22 LAB — CBC
HCT: 45.6 % (ref 39.0–52.0)
Hemoglobin: 15.7 g/dL (ref 13.0–17.0)
MCH: 32.1 pg (ref 26.0–34.0)
MCHC: 34.4 g/dL (ref 30.0–36.0)
MCV: 93.3 fL (ref 80.0–100.0)
Platelets: 275 10*3/uL (ref 150–400)
RBC: 4.89 MIL/uL (ref 4.22–5.81)
RDW: 12.4 % (ref 11.5–15.5)
WBC: 5.2 10*3/uL (ref 4.0–10.5)
nRBC: 0 % (ref 0.0–0.2)

## 2021-02-22 LAB — TROPONIN I (HIGH SENSITIVITY)
Troponin I (High Sensitivity): 2 ng/L (ref <18)
Troponin I (High Sensitivity): 3 ng/L (ref <18)

## 2021-02-22 MED ORDER — IOHEXOL 350 MG/ML SOLN
75.0000 mL | Freq: Once | INTRAVENOUS | Status: AC | PRN
  Administered 2021-02-22: 75 mL via INTRAVENOUS
  Filled 2021-02-22: qty 75

## 2021-02-22 NOTE — ED Triage Notes (Signed)
Pt here with CP that started a week ago. Pt states pain is left sided and radiates to the upper back. Pt states pain is constant. Pt has a clotting disorder and also has abd pain but that is not as worse as the CP.

## 2021-02-22 NOTE — ED Provider Notes (Signed)
[facility] Emergency Department Provider Note    Event Date/Time   First MD Initiated Contact with Patient 02/22/21 1803     (approximate)  I have reviewed the triage vital signs and the nursing notes.   HISTORY  Chief Complaint Chest Pain    HPI Matthew Graves is a 40 y.o. male with the below listed past medical history not currently on anticoagulation presents to the ER for evaluation of left-sided arm pain as well as left shoulder and chest pain.  States that symptoms been ongoing for several days and even weeks.  States she is also had some intermittent shortness of breath that does feel consistent with previous symptoms that he had during his PE for which she was hospitalized.  States he stopped taking anticoagulation because it started causing him to feel weak and tired.  Denies any diaphoresis.  No tick bites.  No lower extremity swelling.  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  Denies any heavy lifting or recent trauma.  Past Medical History:  Diagnosis Date   Anxiety    COPD (chronic obstructive pulmonary disease) (HCC)    DVT (deep venous thrombosis) (HCC)    multiple, in both upper and lower extremities   Pulmonary embolism (HCC)    Family History  Problem Relation Age of Onset   Rheum arthritis Mother    Mental illness Mother        Anxiety   COPD Father    Mental illness Sister        Anxiety   Mental illness Son        Anxiety   Clotting disorder Paternal Grandmother    Clotting disorder Paternal Grandfather    Past Surgical History:  Procedure Laterality Date   FRACTURE SURGERY     pins in fingers   Patient Active Problem List   Diagnosis Date Noted   Recurrent deep vein thrombosis (HCC) 05/29/2017   History of DVT (deep vein thrombosis) 05/28/2017   History of pulmonary embolism 05/28/2017   COPD (chronic obstructive pulmonary disease) (HCC)    Anxiety    DVT (deep venous thrombosis)  (HCC)       Prior to Admission medications   Medication Sig Start Date End Date Taking? Authorizing Provider  ALPRAZolam Prudy Feeler) 1 MG tablet TAKE ONE TABLET BY MOUTH 3 TIMES A DAY 05/01/17   [provider]  methocarbamol (ROBAXIN) 500 MG tablet Take 1 tablet (500 mg total) by mouth every 6 (six) hours as needed for muscle spasms (moderate/severe pain). 10/06/20   Delton Prairie, MD  oxyCODONE (ROXICODONE) 5 MG immediate release tablet Take 1 tablet (5 mg total) by mouth every 4 (four) hours as needed for severe pain or breakthrough pain. 10/06/20 10/06/21  Delton Prairie, MD    Allergies Spiriva handihaler [tiotropium bromide monohydrate]    Social History Social History   Tobacco Use   Smoking status: Never   Smokeless tobacco: Current    Types: Chew  Vaping Use   Vaping Use: Never used  Substance Use Topics   Alcohol use: Yes    Comment: On occasion   Drug use: Not Currently    Types: Marijuana    Review of Systems Patient denies headaches, rhinorrhea, blurry vision, numbness, shortness of breath, chest pain, edema, cough, abdominal pain, nausea, vomiting, diarrhea, dysuria, fevers, rashes or hallucinations unless otherwise stated above in HPI. ____________________________________________   PHYSICAL EXAM:  VITAL SIGNS: Vitals:   02/22/21  1503 02/22/21 1842  BP: (!) 142/106 109/82  Pulse: 95 86  Resp: 18 18  Temp: 98.4 F (36.9 C)   SpO2: 99% 100%    Constitutional: Alert and oriented.  Eyes: Conjunctivae are normal.  Head: Atraumatic. Nose: No congestion/rhinnorhea. Mouth/Throat: Mucous membranes are moist.   Neck: No stridor. Painless ROM.  Cardiovascular: Normal rate, regular rhythm. Grossly normal heart sounds.  Good peripheral circulation. Respiratory: Normal respiratory effort.  No retractions. Lungs CTAB. Gastrointestinal: Soft and nontender. No distention. No abdominal bruits. No CVA tenderness. Genitourinary:  Musculoskeletal: No lower  extremity tenderness nor edema.  No joint effusions. Neurologic:  Normal speech and language. No gross focal neurologic deficits are appreciated. No facial droop Skin:  Skin is warm, dry and intact. No rash noted. Psychiatric: Mood and affect are normal. Speech and behavior are normal.  ____________________________________________   LABS (all labs ordered are listed, but only abnormal results are displayed)  Results for orders placed or performed during the hospital encounter of 02/22/21 (from the past 24 hour(s))  Basic metabolic panel     Status: Abnormal   Collection Time: 02/22/21  3:06 PM  Result Value Ref Range   Sodium 141 135 - 145 mmol/L   Potassium 3.9 3.5 - 5.1 mmol/L   Chloride 104 98 - 111 mmol/L   CO2 31 22 - 32 mmol/L   Glucose, Bld 133 (H) 70 - 99 mg/dL   BUN 8 6 - 20 mg/dL   Creatinine, Ser 1.02 0.61 - 1.24 mg/dL   Calcium 9.1 8.9 - 58.5 mg/dL   GFR, Estimated >27 >78 mL/min   Anion gap 6 5 - 15  CBC     Status: None   Collection Time: 02/22/21  3:06 PM  Result Value Ref Range   WBC 5.2 4.0 - 10.5 K/uL   RBC 4.89 4.22 - 5.81 MIL/uL   Hemoglobin 15.7 13.0 - 17.0 g/dL   HCT 24.2 35.3 - 61.4 %   MCV 93.3 80.0 - 100.0 fL   MCH 32.1 26.0 - 34.0 pg   MCHC 34.4 30.0 - 36.0 g/dL   RDW 43.1 54.0 - 08.6 %   Platelets 275 150 - 400 K/uL   nRBC 0.0 0.0 - 0.2 %  Troponin I (High Sensitivity)     Status: None   Collection Time: 02/22/21  3:06 PM  Result Value Ref Range   Troponin I (High Sensitivity) 2 <18 ng/L  Troponin I (High Sensitivity)     Status: None   Collection Time: 02/22/21  6:36 PM  Result Value Ref Range   Troponin I (High Sensitivity) 3 <18 ng/L   ____________________________________________  EKG My review and personal interpretation at Time: 14:59   Indication: chest pain  Rate: 90  Rhythm: sinus Axis: normal Other: normal intervals, no stemi ____________________________________________  RADIOLOGY  I personally reviewed all radiographic  images ordered to evaluate for the above acute complaints and reviewed radiology reports and findings.  These findings were personally discussed with the patient.  Please see medical record for radiology report.  ____________________________________________   PROCEDURES  Procedure(s) performed:  Procedures    Critical Care performed: no ____________________________________________   INITIAL IMPRESSION / ASSESSMENT AND PLAN / ED COURSE  Pertinent labs & imaging results that were available during my care of the patient were reviewed by me and considered in my medical decision making (see chart for details).   DDX: ACS, pericarditis, esophagitis, pe, dissection, pna, bronchitis, costochondritis   Matthew Graves is a  40 y.o. who presents to the ED with presentation as described above.  Patient clinically well-appearing ambulating in the room in no acute distress.  Is a febrile no hypoxia no respiratory distress.  High risk for PE DVT given history.  Will order CTA as well as venous ultrasound of left upper extremity since this seems to be primary source of his pain and discomfort.  EKG nonischemic.  Troponin negative.  Have a lower suspicion for ACS.  Clinical Course as of 02/22/21 2024  Wed Feb 22, 2021  2018 Ultrasound and CT is reassuring.  Given reassuring work-up patient does appear clinically appropriate for outpatient follow-up.  Have discussed with the patient and available family all diagnostics and treatments performed thus far and all questions were answered to the best of my ability. The patient demonstrates understanding and agreement with plan.  [PR]    Clinical Course User Index [PR] Willy Eddy, MD    The patient was evaluated in Emergency Department today for the symptoms described in the history of present illness. He/she was evaluated in the context of the global COVID-19 pandemic, which necessitated consideration that the patient might be at risk for infection  with the SARS-CoV-2 virus that causes COVID-19. Institutional protocols and algorithms that pertain to the evaluation of patients at risk for COVID-19 are in a state of rapid change based on information released by regulatory bodies including the CDC and federal and state organizations. These policies and algorithms were followed during the patient's care in the ED.  As part of my medical decision making, I reviewed the following data within the electronic MEDICAL RECORD NUMBER Nursing notes reviewed and incorporated, Labs reviewed, notes from prior ED visits and [place] Controlled Substance Database   ____________________________________________   FINAL CLINICAL IMPRESSION(S) / ED DIAGNOSES  Final diagnoses:  Atypical chest pain  Acute pain of left shoulder      NEW MEDICATIONS STARTED DURING THIS VISIT:  New Prescriptions   No medications on file     Note:  This document was prepared using Dragon voice recognition software and may include unintentional dictation errors.    Willy Eddy, MD 02/22/21 2024

## 2023-01-23 DIAGNOSIS — F411 Generalized anxiety disorder: Secondary | ICD-10-CM | POA: Diagnosis not present

## 2023-01-23 DIAGNOSIS — Z5181 Encounter for therapeutic drug level monitoring: Secondary | ICD-10-CM | POA: Diagnosis not present

## 2023-01-23 DIAGNOSIS — F410 Panic disorder [episodic paroxysmal anxiety] without agoraphobia: Secondary | ICD-10-CM | POA: Diagnosis not present

## 2023-02-21 DIAGNOSIS — F410 Panic disorder [episodic paroxysmal anxiety] without agoraphobia: Secondary | ICD-10-CM | POA: Diagnosis not present

## 2023-02-21 DIAGNOSIS — F1190 Opioid use, unspecified, uncomplicated: Secondary | ICD-10-CM | POA: Diagnosis not present

## 2023-02-21 DIAGNOSIS — F411 Generalized anxiety disorder: Secondary | ICD-10-CM | POA: Diagnosis not present

## 2023-02-21 DIAGNOSIS — Z5181 Encounter for therapeutic drug level monitoring: Secondary | ICD-10-CM | POA: Diagnosis not present

## 2023-03-06 DIAGNOSIS — F411 Generalized anxiety disorder: Secondary | ICD-10-CM | POA: Diagnosis not present

## 2023-03-06 DIAGNOSIS — F410 Panic disorder [episodic paroxysmal anxiety] without agoraphobia: Secondary | ICD-10-CM | POA: Diagnosis not present

## 2023-03-06 DIAGNOSIS — Z5181 Encounter for therapeutic drug level monitoring: Secondary | ICD-10-CM | POA: Diagnosis not present

## 2023-03-06 DIAGNOSIS — F1190 Opioid use, unspecified, uncomplicated: Secondary | ICD-10-CM | POA: Diagnosis not present

## 2023-04-08 DIAGNOSIS — F411 Generalized anxiety disorder: Secondary | ICD-10-CM | POA: Diagnosis not present

## 2023-04-08 DIAGNOSIS — F1190 Opioid use, unspecified, uncomplicated: Secondary | ICD-10-CM | POA: Diagnosis not present

## 2023-04-08 DIAGNOSIS — F410 Panic disorder [episodic paroxysmal anxiety] without agoraphobia: Secondary | ICD-10-CM | POA: Diagnosis not present

## 2023-04-08 DIAGNOSIS — Z5181 Encounter for therapeutic drug level monitoring: Secondary | ICD-10-CM | POA: Diagnosis not present

## 2023-05-10 DIAGNOSIS — F331 Major depressive disorder, recurrent, moderate: Secondary | ICD-10-CM | POA: Diagnosis not present

## 2023-05-10 DIAGNOSIS — F410 Panic disorder [episodic paroxysmal anxiety] without agoraphobia: Secondary | ICD-10-CM | POA: Diagnosis not present

## 2023-05-10 DIAGNOSIS — F1120 Opioid dependence, uncomplicated: Secondary | ICD-10-CM | POA: Diagnosis not present

## 2023-05-10 DIAGNOSIS — F411 Generalized anxiety disorder: Secondary | ICD-10-CM | POA: Diagnosis not present

## 2023-05-10 DIAGNOSIS — F1190 Opioid use, unspecified, uncomplicated: Secondary | ICD-10-CM | POA: Diagnosis not present

## 2023-05-10 DIAGNOSIS — Z79899 Other long term (current) drug therapy: Secondary | ICD-10-CM | POA: Diagnosis not present

## 2023-05-10 DIAGNOSIS — Z5181 Encounter for therapeutic drug level monitoring: Secondary | ICD-10-CM | POA: Diagnosis not present

## 2023-08-09 DIAGNOSIS — F1120 Opioid dependence, uncomplicated: Secondary | ICD-10-CM | POA: Diagnosis not present

## 2023-08-09 DIAGNOSIS — F1190 Opioid use, unspecified, uncomplicated: Secondary | ICD-10-CM | POA: Diagnosis not present

## 2023-08-09 DIAGNOSIS — Z79899 Other long term (current) drug therapy: Secondary | ICD-10-CM | POA: Diagnosis not present

## 2023-08-09 DIAGNOSIS — F411 Generalized anxiety disorder: Secondary | ICD-10-CM | POA: Diagnosis not present

## 2023-08-09 DIAGNOSIS — Z5181 Encounter for therapeutic drug level monitoring: Secondary | ICD-10-CM | POA: Diagnosis not present

## 2023-08-09 DIAGNOSIS — F410 Panic disorder [episodic paroxysmal anxiety] without agoraphobia: Secondary | ICD-10-CM | POA: Diagnosis not present

## 2023-08-09 DIAGNOSIS — F331 Major depressive disorder, recurrent, moderate: Secondary | ICD-10-CM | POA: Diagnosis not present

## 2023-09-06 DIAGNOSIS — F411 Generalized anxiety disorder: Secondary | ICD-10-CM | POA: Diagnosis not present

## 2023-09-06 DIAGNOSIS — F410 Panic disorder [episodic paroxysmal anxiety] without agoraphobia: Secondary | ICD-10-CM | POA: Diagnosis not present

## 2023-09-06 DIAGNOSIS — Z5181 Encounter for therapeutic drug level monitoring: Secondary | ICD-10-CM | POA: Diagnosis not present

## 2023-09-06 DIAGNOSIS — F331 Major depressive disorder, recurrent, moderate: Secondary | ICD-10-CM | POA: Diagnosis not present

## 2023-10-04 DIAGNOSIS — Z5181 Encounter for therapeutic drug level monitoring: Secondary | ICD-10-CM | POA: Diagnosis not present

## 2023-10-04 DIAGNOSIS — F411 Generalized anxiety disorder: Secondary | ICD-10-CM | POA: Diagnosis not present

## 2023-10-04 DIAGNOSIS — F410 Panic disorder [episodic paroxysmal anxiety] without agoraphobia: Secondary | ICD-10-CM | POA: Diagnosis not present

## 2023-10-04 DIAGNOSIS — F1120 Opioid dependence, uncomplicated: Secondary | ICD-10-CM | POA: Diagnosis not present

## 2023-10-04 DIAGNOSIS — F331 Major depressive disorder, recurrent, moderate: Secondary | ICD-10-CM | POA: Diagnosis not present

## 2023-11-22 DIAGNOSIS — F1120 Opioid dependence, uncomplicated: Secondary | ICD-10-CM | POA: Diagnosis not present

## 2023-11-22 DIAGNOSIS — F410 Panic disorder [episodic paroxysmal anxiety] without agoraphobia: Secondary | ICD-10-CM | POA: Diagnosis not present

## 2023-11-22 DIAGNOSIS — F411 Generalized anxiety disorder: Secondary | ICD-10-CM | POA: Diagnosis not present

## 2023-11-22 DIAGNOSIS — F331 Major depressive disorder, recurrent, moderate: Secondary | ICD-10-CM | POA: Diagnosis not present

## 2024-01-22 DIAGNOSIS — F1120 Opioid dependence, uncomplicated: Secondary | ICD-10-CM | POA: Diagnosis not present

## 2024-01-22 DIAGNOSIS — F411 Generalized anxiety disorder: Secondary | ICD-10-CM | POA: Diagnosis not present

## 2024-01-22 DIAGNOSIS — F331 Major depressive disorder, recurrent, moderate: Secondary | ICD-10-CM | POA: Diagnosis not present

## 2024-01-22 DIAGNOSIS — Z5181 Encounter for therapeutic drug level monitoring: Secondary | ICD-10-CM | POA: Diagnosis not present

## 2024-01-22 DIAGNOSIS — F410 Panic disorder [episodic paroxysmal anxiety] without agoraphobia: Secondary | ICD-10-CM | POA: Diagnosis not present

## 2024-01-22 DIAGNOSIS — Z79899 Other long term (current) drug therapy: Secondary | ICD-10-CM | POA: Diagnosis not present

## 2024-03-13 ENCOUNTER — Emergency Department
Admission: EM | Admit: 2024-03-13 | Discharge: 2024-03-13 | Disposition: A | Discharge status: Home / Self Care | Attending: Emergency Medicine | Admitting: Emergency Medicine

## 2024-03-13 ENCOUNTER — Emergency Department

## 2024-03-13 ENCOUNTER — Other Ambulatory Visit: Payer: Self-pay

## 2024-03-13 DIAGNOSIS — I2699 Other pulmonary embolism without acute cor pulmonale: Secondary | ICD-10-CM | POA: Diagnosis not present

## 2024-03-13 DIAGNOSIS — R079 Chest pain, unspecified: Secondary | ICD-10-CM | POA: Diagnosis not present

## 2024-03-13 DIAGNOSIS — R0602 Shortness of breath: Secondary | ICD-10-CM | POA: Diagnosis not present

## 2024-03-13 DIAGNOSIS — J449 Chronic obstructive pulmonary disease, unspecified: Secondary | ICD-10-CM | POA: Diagnosis not present

## 2024-03-13 DIAGNOSIS — R0989 Other specified symptoms and signs involving the circulatory and respiratory systems: Secondary | ICD-10-CM | POA: Diagnosis not present

## 2024-03-13 DIAGNOSIS — J9811 Atelectasis: Secondary | ICD-10-CM | POA: Diagnosis not present

## 2024-03-13 LAB — BASIC METABOLIC PANEL WITH GFR
Anion gap: 15 (ref 5–15)
BUN: 8 mg/dL (ref 6–20)
CO2: 24 mmol/L (ref 22–32)
Calcium: 9.2 mg/dL (ref 8.9–10.3)
Chloride: 100 mmol/L (ref 98–111)
Creatinine, Ser: 1.04 mg/dL (ref 0.61–1.24)
GFR, Estimated: >60 mL/min (ref >60)
Glucose, Bld: 179 mg/dL — ABNORMAL HIGH (ref 70–99)
Potassium: 3.8 mmol/L (ref 3.5–5.1)
Sodium: 139 mmol/L (ref 135–145)

## 2024-03-13 LAB — CBC
HCT: 48.6 % (ref 39.0–52.0)
Hemoglobin: 16.6 g/dL (ref 13.0–17.0)
MCH: 31.6 pg (ref 26.0–34.0)
MCHC: 34.2 g/dL (ref 30.0–36.0)
MCV: 92.6 fL (ref 80.0–100.0)
Platelets: 223 K/uL (ref 150–400)
RBC: 5.25 MIL/uL (ref 4.22–5.81)
RDW: 12.8 % (ref 11.5–15.5)
WBC: 7.4 K/uL (ref 4.0–10.5)
nRBC: 0 % (ref 0.0–0.2)

## 2024-03-13 LAB — TROPONIN I (HIGH SENSITIVITY): Troponin I (High Sensitivity): <2 ng/L (ref <18)

## 2024-03-13 MED ORDER — APIXABAN (ELIQUIS) VTE STARTER PACK (10MG AND 5MG): ORAL_TABLET | ORAL | 0 refills | Status: DC

## 2024-03-13 MED ORDER — IOHEXOL 350 MG/ML SOLN
75.0000 mL | Freq: Once | INTRAVENOUS | Status: AC | PRN
  Administered 2024-03-13: 75 mL via INTRAVENOUS

## 2024-03-13 NOTE — ED Provider Notes (Signed)
 [facility] Provider Note    Event Date/Time   First MD Initiated Contact with Patient 03/13/24 [phone]     (approximate)   History   Chief Complaint: Chest Pain and Shortness of Breath   HPI  Matthew Graves is a 43 y.o. male with a history of anxiety COPD pulmonary embolism, not currently on any anticoagulation who comes ED complaining of chest pain that started at 3:00 AM, waking him from sleep, constant.  Currently improved but still mild.  Associated with shortness of breath.  Hurts to take a deep breath.  Feels like previous PE.  No lightheadedness        Past Medical History:  Diagnosis Date   Anxiety    COPD (chronic obstructive pulmonary disease) (HCC)    DVT (deep venous thrombosis) (HCC)    multiple, in both upper and lower extremities   Pulmonary embolism [facility])     Current Outpatient Rx   Order #: [id number] Class: Normal   Order #: [id number] Class: Historical Med   Order #: [id number] Class: Normal    Past Surgical History:  Procedure Laterality Date   FRACTURE SURGERY     pins in fingers    Physical Exam   Triage Vital Signs: ED Triage Vitals  Encounter Vitals Group     BP 03/13/24 0853 (!) 118/107     Girls Systolic BP Percentile --      Girls Diastolic BP Percentile --      Boys Systolic BP Percentile --      Boys Diastolic BP Percentile --      Pulse Rate 03/13/24 0853 91     Resp 03/13/24 0853 20     Temp 03/13/24 0853 97.8 F (36.6 C)     Temp src --      SpO2 03/13/24 0853 100 %     Weight 03/13/24 0854 185 lb (83.9 kg)     Height 03/13/24 0854 6' 2 (1.88 m)     Head Circumference --      Peak Flow --      Pain Score 03/13/24 0853 9     Pain Loc --      Pain Education --      Exclude from Growth Chart --     Most recent vital signs: Vitals:   03/13/24 1030 03/13/24 1100  BP: 109/83 117/72  Pulse: 78 81  Resp: (!) 21 13  Temp:    SpO2: 100% 98%    General: Awake, no distress.  CV:  Good peripheral  perfusion.  Regular rate rhythm Resp:  Normal effort.  Clear lungs Abd:  No distention.  Soft nontender Other:  Moist oral mucosa   ED Results / Procedures / Treatments   Labs (all labs ordered are listed, but only abnormal results are displayed) Labs Reviewed  BASIC METABOLIC PANEL WITH GFR - Abnormal; Notable for the following components:      Result Value   Glucose, Bld 179 (*)    All other components within normal limits  CBC  TROPONIN I (HIGH SENSITIVITY)  TROPONIN I (HIGH SENSITIVITY)     EKG Interpreted by me Sinus rhythm rate of 87.  Normal axis intervals QRS ST segments T waves   RADIOLOGY Chest x-ray interpreted by me, unremarkable. CT chest positive for 1 small right lung PE.  Discussed with radiologist.   PROCEDURES:  Procedures   MEDICATIONS ORDERED IN ED: Medications  iohexol  (OMNIPAQUE ) 350 MG/ML injection 75 mL (75 mLs Intravenous Contrast Given  03/13/24 1032)     IMPRESSION / MDM / ASSESSMENT AND PLAN / ED COURSE  I reviewed the triage vital signs and the nursing notes.  DDx: Pneumothorax, PE, GERD, non-STEMI  Patient's presentation is most consistent with acute presentation with potential threat to life or bodily function.  Patient presents with chest pain shortness of breath since this morning.  Exam vital signs and serum labs are unremarkable.  CT positive for 1 small PE, not hemodynamically significant.  Vital signs are normal.  Symptoms are minimal.  Stable for restarting Eliquis  and follow-up with hematology plus PCP.       FINAL CLINICAL IMPRESSION(S) / ED DIAGNOSES   Final diagnoses:  Recurrent pulmonary embolism (HCC)     Rx / DC Orders   ED Discharge Orders          Ordered    Ambulatory Referral to Primary Care (Establish Care)        03/13/24 1126    APIXABAN  (ELIQUIS ) VTE STARTER PACK (10MG  AND 5MG )       Note to Pharmacy: If starter pack unavailable, substitute with seventy-four 5 mg apixaban  tabs following the above  SIG directions.   03/13/24 1127             Note:  This document was prepared using Dragon voice recognition software and may include unintentional dictation errors.   Viviann Pastor, MD 03/13/24 [phone]

## 2024-03-13 NOTE — ED Triage Notes (Signed)
 Pt to ED for shob, chest pain, back pain started at 0330 today. Reports feels similar to when had PE in the past. RR unlabored. Reports worsening pain when lying on back.

## 2024-03-20 ENCOUNTER — Inpatient Hospital Stay

## 2024-03-20 ENCOUNTER — Inpatient Hospital Stay: Admitting: Oncology

## 2024-03-29 ENCOUNTER — Inpatient Hospital Stay
Admission: EM | Admit: 2024-03-29 | Discharge: 2024-04-02 | DRG: 417 | Disposition: A | Discharge status: Home / Self Care | Attending: Internal Medicine | Admitting: Internal Medicine

## 2024-03-29 ENCOUNTER — Other Ambulatory Visit: Payer: Self-pay

## 2024-03-29 ENCOUNTER — Emergency Department

## 2024-03-29 ENCOUNTER — Encounter: Payer: Self-pay | Admitting: Emergency Medicine

## 2024-03-29 DIAGNOSIS — K858 Other acute pancreatitis without necrosis or infection: Secondary | ICD-10-CM | POA: Diagnosis present

## 2024-03-29 DIAGNOSIS — J449 Chronic obstructive pulmonary disease, unspecified: Secondary | ICD-10-CM | POA: Diagnosis present

## 2024-03-29 DIAGNOSIS — Z86711 Personal history of pulmonary embolism: Secondary | ICD-10-CM

## 2024-03-29 DIAGNOSIS — Z79899 Other long term (current) drug therapy: Secondary | ICD-10-CM

## 2024-03-29 DIAGNOSIS — K8020 Calculus of gallbladder without cholecystitis without obstruction: Secondary | ICD-10-CM | POA: Diagnosis not present

## 2024-03-29 DIAGNOSIS — G8929 Other chronic pain: Secondary | ICD-10-CM | POA: Diagnosis present

## 2024-03-29 DIAGNOSIS — Z8261 Family history of arthritis: Secondary | ICD-10-CM

## 2024-03-29 DIAGNOSIS — R0602 Shortness of breath: Secondary | ICD-10-CM | POA: Diagnosis not present

## 2024-03-29 DIAGNOSIS — R1013 Epigastric pain: Secondary | ICD-10-CM | POA: Diagnosis not present

## 2024-03-29 DIAGNOSIS — Z832 Family history of diseases of the blood and blood-forming organs and certain disorders involving the immune mechanism: Secondary | ICD-10-CM

## 2024-03-29 DIAGNOSIS — Z825 Family history of asthma and other chronic lower respiratory diseases: Secondary | ICD-10-CM

## 2024-03-29 DIAGNOSIS — Z888 Allergy status to other drugs, medicaments and biological substances status: Secondary | ICD-10-CM

## 2024-03-29 DIAGNOSIS — R1010 Upper abdominal pain, unspecified: Secondary | ICD-10-CM | POA: Diagnosis not present

## 2024-03-29 DIAGNOSIS — K8010 Calculus of gallbladder with chronic cholecystitis without obstruction: Principal | ICD-10-CM | POA: Diagnosis present

## 2024-03-29 DIAGNOSIS — F17220 Nicotine dependence, chewing tobacco, uncomplicated: Secondary | ICD-10-CM | POA: Diagnosis present

## 2024-03-29 DIAGNOSIS — R0789 Other chest pain: Secondary | ICD-10-CM | POA: Diagnosis not present

## 2024-03-29 DIAGNOSIS — K859 Acute pancreatitis without necrosis or infection, unspecified: Secondary | ICD-10-CM | POA: Diagnosis present

## 2024-03-29 DIAGNOSIS — Z86718 Personal history of other venous thrombosis and embolism: Secondary | ICD-10-CM

## 2024-03-29 DIAGNOSIS — Z818 Family history of other mental and behavioral disorders: Secondary | ICD-10-CM

## 2024-03-29 DIAGNOSIS — E876 Hypokalemia: Secondary | ICD-10-CM | POA: Diagnosis present

## 2024-03-29 DIAGNOSIS — M545 Low back pain, unspecified: Secondary | ICD-10-CM | POA: Diagnosis not present

## 2024-03-29 DIAGNOSIS — F32A Depression, unspecified: Secondary | ICD-10-CM | POA: Diagnosis present

## 2024-03-29 DIAGNOSIS — R079 Chest pain, unspecified: Secondary | ICD-10-CM | POA: Diagnosis not present

## 2024-03-29 DIAGNOSIS — Z7901 Long term (current) use of anticoagulants: Secondary | ICD-10-CM

## 2024-03-29 DIAGNOSIS — F419 Anxiety disorder, unspecified: Secondary | ICD-10-CM | POA: Diagnosis present

## 2024-03-29 DIAGNOSIS — K851 Biliary acute pancreatitis without necrosis or infection: Secondary | ICD-10-CM | POA: Diagnosis present

## 2024-03-29 LAB — BASIC METABOLIC PANEL WITH GFR
Anion gap: 12 (ref 5–15)
BUN: 11 mg/dL (ref 6–20)
CO2: 23 mmol/L (ref 22–32)
Calcium: 9.2 mg/dL (ref 8.9–10.3)
Chloride: 103 mmol/L (ref 98–111)
Creatinine, Ser: 0.86 mg/dL (ref 0.61–1.24)
GFR, Estimated: >60 mL/min (ref >60)
Glucose, Bld: 137 mg/dL — ABNORMAL HIGH (ref 70–99)
Potassium: 4.5 mmol/L (ref 3.5–5.1)
Sodium: 138 mmol/L (ref 135–145)

## 2024-03-29 LAB — CBC
HCT: 49.4 % (ref 39.0–52.0)
Hemoglobin: 17 g/dL (ref 13.0–17.0)
MCH: 31.4 pg (ref 26.0–34.0)
MCHC: 34.4 g/dL (ref 30.0–36.0)
MCV: 91.1 fL (ref 80.0–100.0)
Platelets: 361 K/uL (ref 150–400)
RBC: 5.42 MIL/uL (ref 4.22–5.81)
RDW: 12 % (ref 11.5–15.5)
WBC: 13.7 K/uL — ABNORMAL HIGH (ref 4.0–10.5)
nRBC: 0 % (ref 0.0–0.2)

## 2024-03-29 LAB — TROPONIN I (HIGH SENSITIVITY): Troponin I (High Sensitivity): 3 ng/L (ref <18)

## 2024-03-29 MED ORDER — SODIUM CHLORIDE 0.9 % IV BOLUS
1000.0000 mL | Freq: Once | INTRAVENOUS | Status: AC
  Administered 2024-03-29: 1000 mL via INTRAVENOUS

## 2024-03-29 MED ORDER — METOCLOPRAMIDE HCL 5 MG/ML IJ SOLN
10.0000 mg | INTRAMUSCULAR | Status: AC
  Administered 2024-03-29: 10 mg via INTRAVENOUS
  Filled 2024-03-29: qty 2

## 2024-03-29 MED ORDER — PANTOPRAZOLE SODIUM 40 MG IV SOLR
40.0000 mg | Freq: Once | INTRAVENOUS | Status: AC
  Administered 2024-03-29: 40 mg via INTRAVENOUS
  Filled 2024-03-29: qty 10

## 2024-03-29 MED ORDER — KETOROLAC TROMETHAMINE 15 MG/ML IJ SOLN
15.0000 mg | Freq: Once | INTRAMUSCULAR | Status: AC
  Administered 2024-03-29: 15 mg via INTRAVENOUS
  Filled 2024-03-29: qty 1

## 2024-03-29 NOTE — ED Provider Notes (Signed)
 [facility] Provider Note    Event Date/Time   First MD Initiated Contact with Patient 03/29/24 2137     (approximate)   History   Chief Complaint: Chest Pain   HPI  Matthew Graves is a 43 y.o. male with a history of COPD, PE on Eliquis  who comes ED complaining of epigastric pain radiating to his back that started today at 5:00 PM.  Reports decreased appetite over the last week.  Denies fever or chills, no aggravating or alleviating factors otherwise.        Past Medical History:  Diagnosis Date   Anxiety    COPD (chronic obstructive pulmonary disease) (HCC)    DVT (deep venous thrombosis) (HCC)    multiple, in both upper and lower extremities   Pulmonary embolism [facility])     Current Outpatient Rx   Order #: [id number] Class: Historical Med   Order #: [id number] Class: Normal   Order #: [id number] Class: Normal    Past Surgical History:  Procedure Laterality Date   FRACTURE SURGERY     pins in fingers    Physical Exam   Triage Vital Signs: ED Triage Vitals  Encounter Vitals Group     BP 03/29/24 2032 117/79     Girls Systolic BP Percentile --      Girls Diastolic BP Percentile --      Boys Systolic BP Percentile --      Boys Diastolic BP Percentile --      Pulse Rate 03/29/24 2032 85     Resp 03/29/24 2032 17     Temp 03/29/24 2032 98.5 F (36.9 C)     Temp Source 03/29/24 2032 Oral     SpO2 03/29/24 2032 97 %     Weight 03/29/24 2025 185 lb (83.9 kg)     Height 03/29/24 2025 6' 2 (1.88 m)     Head Circumference --      Peak Flow --      Pain Score 03/29/24 2025 8     Pain Loc --      Pain Education --      Exclude from Growth Chart --     Most recent vital signs: Vitals:   03/29/24 2032  BP: 117/79  Pulse: 85  Resp: 17  Temp: 98.5 F (36.9 C)  SpO2: 97%    General: Awake, no distress.  CV:  Good peripheral perfusion.  Regular rate rhythm Resp:  Normal effort.  Clear lungs Abd:  No distention.  Epigastric and right  upper quadrant tenderness Other:  Moist oral mucosa.  Symmetric distal pulses   ED Results / Procedures / Treatments   Labs (all labs ordered are listed, but only abnormal results are displayed) Labs Reviewed  BASIC METABOLIC PANEL WITH GFR - Abnormal; Notable for the following components:      Result Value   Glucose, Bld 137 (*)    All other components within normal limits  CBC - Abnormal; Notable for the following components:   WBC 13.7 (*)    All other components within normal limits  HEPATIC FUNCTION PANEL  LIPASE, BLOOD  TROPONIN I (HIGH SENSITIVITY)  TROPONIN I (HIGH SENSITIVITY)     EKG Interpreted by me Sinus rhythm rate of 88.  Normal axis intervals QRS.  ST depression in V3 V4.   RADIOLOGY Chest x-ray interpreted by me, normal.  Radiology report reviewed   PROCEDURES:  Procedures   MEDICATIONS ORDERED IN ED: Medications  sodium chloride 0.9 % bolus  1,000 mL (1,000 mLs Intravenous New Bag/Given 03/29/24 2309)  ketorolac  (TORADOL ) 15 MG/ML injection 15 mg (15 mg Intravenous Given 03/29/24 2300)  pantoprazole (PROTONIX) injection 40 mg (40 mg Intravenous Given 03/29/24 2300)  metoCLOPramide (REGLAN) injection 10 mg (10 mg Intravenous Given 03/29/24 2259)     IMPRESSION / MDM / ASSESSMENT AND PLAN / ED COURSE  I reviewed the triage vital signs and the nursing notes.  DDx: Pancreatitis, gastritis, biliary colic, AKI, non-STEMI.  Doubt acute PE, dissection, abdominal aneurysm, mesenteric ischemia.  Patient's presentation is most consistent with acute presentation with potential threat to life or bodily function.  Patient presents with epigastric pain with upper abdominal tenderness.  Will obtain LFTs, right upper quadrant ultrasound while giving Toradol  Reglan and Protonix.  EKG shows some ST depression anteriorly, nonspecific given his lack of exertional symptoms, chest pain, shortness of breath.  Previous EKGs do show similar morphologies.  Initial troponin  normal, will trend       FINAL CLINICAL IMPRESSION(S) / ED DIAGNOSES   Final diagnoses:  Epigastric pain     Rx / DC Orders   ED Discharge Orders          Ordered    Ambulatory Referral to Primary Care (Establish Care)        03/29/24 2237             Note:  This document was prepared using Dragon voice recognition software and may include unintentional dictation errors.   Viviann Pastor, MD 03/29/24 [phone]

## 2024-03-29 NOTE — ED Triage Notes (Signed)
 Pt to ED via POV for SOB, and chest pain. Pt states 2 weeks ago was dx with PE. Tonight at 1730 developed CP and SHOB. Pt states pain to center of his chest. Pt also c/o lower back pain.

## 2024-03-30 ENCOUNTER — Inpatient Hospital Stay: Admitting: Oncology

## 2024-03-30 ENCOUNTER — Emergency Department

## 2024-03-30 ENCOUNTER — Inpatient Hospital Stay

## 2024-03-30 DIAGNOSIS — K851 Biliary acute pancreatitis without necrosis or infection: Secondary | ICD-10-CM | POA: Diagnosis not present

## 2024-03-30 DIAGNOSIS — K859 Acute pancreatitis without necrosis or infection, unspecified: Secondary | ICD-10-CM | POA: Diagnosis present

## 2024-03-30 DIAGNOSIS — K8020 Calculus of gallbladder without cholecystitis without obstruction: Secondary | ICD-10-CM | POA: Diagnosis not present

## 2024-03-30 LAB — COMPREHENSIVE METABOLIC PANEL WITH GFR
ALT: 253 U/L — ABNORMAL HIGH (ref 0–44)
AST: 228 U/L — ABNORMAL HIGH (ref 15–41)
Albumin: 3.1 g/dL — ABNORMAL LOW (ref 3.5–5.0)
Alkaline Phosphatase: 57 U/L (ref 38–126)
Anion gap: 13 (ref 5–15)
BUN: 9 mg/dL (ref 6–20)
CO2: 23 mmol/L (ref 22–32)
Calcium: 8.7 mg/dL — ABNORMAL LOW (ref 8.9–10.3)
Chloride: 103 mmol/L (ref 98–111)
Creatinine, Ser: 0.79 mg/dL (ref 0.61–1.24)
GFR, Estimated: >60 mL/min (ref >60)
Glucose, Bld: 92 mg/dL (ref 70–99)
Potassium: 4 mmol/L (ref 3.5–5.1)
Sodium: 139 mmol/L (ref 135–145)
Total Bilirubin: 4.5 mg/dL — ABNORMAL HIGH (ref 0.0–1.2)
Total Protein: 5.5 g/dL — ABNORMAL LOW (ref 6.5–8.1)

## 2024-03-30 LAB — HEPATIC FUNCTION PANEL
ALT: 231 U/L — ABNORMAL HIGH (ref 0–44)
AST: 292 U/L — ABNORMAL HIGH (ref 15–41)
Albumin: 3.1 g/dL — ABNORMAL LOW (ref 3.5–5.0)
Alkaline Phosphatase: 52 U/L (ref 38–126)
Bilirubin, Direct: 1.2 mg/dL — ABNORMAL HIGH (ref 0.0–0.2)
Indirect Bilirubin: 1.4 mg/dL — ABNORMAL HIGH (ref 0.3–0.9)
Total Bilirubin: 2.6 mg/dL — ABNORMAL HIGH (ref 0.0–1.2)
Total Protein: 5.5 g/dL — ABNORMAL LOW (ref 6.5–8.1)

## 2024-03-30 LAB — CBC WITH DIFFERENTIAL/PLATELET
Abs Immature Granulocytes: 0.02 K/uL (ref 0.00–0.07)
Basophils Absolute: 0 K/uL (ref 0.0–0.1)
Basophils Relative: 1 %
Eosinophils Absolute: 0.1 K/uL (ref 0.0–0.5)
Eosinophils Relative: 1 %
HCT: 38.1 % — ABNORMAL LOW (ref 39.0–52.0)
Hemoglobin: 13.6 g/dL (ref 13.0–17.0)
Immature Granulocytes: 0 %
Lymphocytes Relative: 14 %
Lymphs Abs: 0.9 K/uL (ref 0.7–4.0)
MCH: 31.8 pg (ref 26.0–34.0)
MCHC: 35.7 g/dL (ref 30.0–36.0)
MCV: 89 fL (ref 80.0–100.0)
Monocytes Absolute: 0.5 K/uL (ref 0.1–1.0)
Monocytes Relative: 8 %
Neutro Abs: 5 K/uL (ref 1.7–7.7)
Neutrophils Relative %: 76 %
Platelets: 229 K/uL (ref 150–400)
RBC: 4.28 MIL/uL (ref 4.22–5.81)
RDW: 12 % (ref 11.5–15.5)
WBC: 6.5 K/uL (ref 4.0–10.5)
nRBC: 0 % (ref 0.0–0.2)

## 2024-03-30 LAB — HEPARIN LEVEL (UNFRACTIONATED): Heparin Unfractionated: >1.1 [IU]/mL — ABNORMAL HIGH (ref 0.30–0.70)

## 2024-03-30 LAB — LIPASE, BLOOD: Lipase: 2472 U/L — ABNORMAL HIGH (ref 11–51)

## 2024-03-30 LAB — HIV ANTIBODY (ROUTINE TESTING W REFLEX): HIV Screen 4th Generation wRfx: NONREACTIVE

## 2024-03-30 LAB — PROTIME-INR
INR: 1.3 — ABNORMAL HIGH (ref 0.8–1.2)
Prothrombin Time: 17.4 s — ABNORMAL HIGH (ref 11.4–15.2)

## 2024-03-30 LAB — APTT: aPTT: 32 s (ref 24–36)

## 2024-03-30 LAB — TROPONIN I (HIGH SENSITIVITY): Troponin I (High Sensitivity): 4 ng/L (ref <18)

## 2024-03-30 MED ORDER — MORPHINE SULFATE (PF) 2 MG/ML IV SOLN: 2.0000 mg | INTRAVENOUS | Status: DC | PRN

## 2024-03-30 MED ORDER — LACTATED RINGERS IV SOLN: INTRAVENOUS | Status: AC

## 2024-03-30 MED ORDER — BUPRENORPHINE HCL-NALOXONE HCL 2-0.5 MG SL SUBL
1.0000 | SUBLINGUAL_TABLET | Freq: Two times a day (BID) | SUBLINGUAL | Status: DC
Start: 2024-03-30 — End: 2024-04-02
  Administered 2024-03-30 – 2024-04-02 (×7): 1 via SUBLINGUAL
  Filled 2024-03-30 (×7): qty 1

## 2024-03-30 MED ORDER — APIXABAN 5 MG PO TABS
5.0000 mg | ORAL_TABLET | Freq: Two times a day (BID) | ORAL | Status: DC
Start: 2024-03-30 — End: 2024-03-30
  Administered 2024-03-30: 5 mg via ORAL
  Filled 2024-03-30: qty 1

## 2024-03-30 MED ORDER — HYDROCODONE-ACETAMINOPHEN 5-325 MG PO TABS: 1.0000 | ORAL_TABLET | Freq: Four times a day (QID) | ORAL | Status: DC | PRN

## 2024-03-30 MED ORDER — MELATONIN 5 MG PO TABS
2.5000 mg | ORAL_TABLET | Freq: Every day | ORAL | Status: DC
  Administered 2024-03-30 – 2024-04-01 (×3): 2.5 mg via ORAL
  Filled 2024-03-30 (×3): qty 1

## 2024-03-30 MED ORDER — PAROXETINE HCL 10 MG PO TABS
10.0000 mg | ORAL_TABLET | Freq: Every day | ORAL | Status: DC
  Administered 2024-03-30 – 2024-04-02 (×3): 10 mg via ORAL
  Filled 2024-03-30 (×4): qty 1

## 2024-03-30 MED ORDER — MORPHINE SULFATE (PF) 4 MG/ML IV SOLN: 4.0000 mg | Freq: Once | INTRAVENOUS | Status: DC | PRN

## 2024-03-30 MED ORDER — BREXPIPRAZOLE 1 MG PO TABS
1.0000 mg | ORAL_TABLET | Freq: Every day | ORAL | Status: DC
  Administered 2024-03-30 – 2024-04-01 (×3): 1 mg via ORAL
  Filled 2024-03-30 (×3): qty 1

## 2024-03-30 MED ORDER — KETOROLAC TROMETHAMINE 30 MG/ML IJ SOLN
15.0000 mg | Freq: Three times a day (TID) | INTRAMUSCULAR | Status: DC
  Administered 2024-03-30 – 2024-04-01 (×5): 15 mg via INTRAVENOUS
  Filled 2024-03-30 (×5): qty 1

## 2024-03-30 MED ORDER — HEPARIN (PORCINE) 25000 UT/250ML-% IV SOLN
1200.0000 [IU]/h | INTRAVENOUS | Status: DC
  Administered 2024-03-30: 1300 [IU]/h via INTRAVENOUS
  Administered 2024-03-31: 1200 [IU]/h via INTRAVENOUS
  Filled 2024-03-30 (×3): qty 250

## 2024-03-30 MED ORDER — ALPRAZOLAM 0.5 MG PO TABS
1.0000 mg | ORAL_TABLET | Freq: Two times a day (BID) | ORAL | Status: DC
Start: 2024-03-30 — End: 2024-04-02
  Administered 2024-03-30 – 2024-04-02 (×7): 1 mg via ORAL
  Filled 2024-03-30 (×7): qty 2

## 2024-03-30 MED ORDER — ACETAMINOPHEN 325 MG PO TABS: 650.0000 mg | ORAL_TABLET | Freq: Four times a day (QID) | ORAL | Status: DC | PRN

## 2024-03-30 MED ORDER — SODIUM CHLORIDE 0.9 % IV BOLUS
1000.0000 mL | Freq: Once | INTRAVENOUS | Status: AC
  Administered 2024-03-30: 1000 mL via INTRAVENOUS

## 2024-03-30 MED ORDER — HEPARIN SODIUM (PORCINE) 5000 UNIT/ML IJ SOLN
5000.0000 [IU] | Freq: Three times a day (TID) | INTRAMUSCULAR | Status: DC
Start: 2024-03-30 — End: 2024-03-30

## 2024-03-30 NOTE — ED Notes (Addendum)
 MD in room to speak with pt per request

## 2024-03-30 NOTE — Progress Notes (Signed)
 PHARMACY - ANTICOAGULATION CONSULT NOTE  Pharmacy Consult for Heparin Infusion Indication: pulmonary embolus  Allergies  Allergen Reactions   Spiriva Handihaler [Tiotropium Bromide Monohydrate]     Patient Measurements: Height: 6' 2 (188 cm) Weight: 83.9 kg (185 lb) IBW/kg (Calculated) : 82.2 HEPARIN DW (KG): 83.9  Vital Signs: Temp: 98.2 F (36.8 C) (10/06 0710) Temp Source: Oral (10/06 0710) BP: 104/77 (10/06 0645) Pulse Rate: 77 (10/06 0645)  Labs: Recent Labs    03/29/24 2026 03/30/24 0054 03/30/24 1002 03/30/24 1117  HGB 17.0  --  13.6  --   HCT 49.4  --  38.1*  --   PLT 361  --  229  --   APTT  --   --   --  32  LABPROT  --   --   --  17.4*  INR  --   --   --  1.3*  HEPARINUNFRC  --   --   --  >1.10*  CREATININE 0.86  --  0.79  --   TROPONINIHS 3 4  --   --     Estimated Creatinine Clearance: 135.6 mL/min (by C-G formula based on SCr of 0.79 mg/dL).   Medical History: Past Medical History:  Diagnosis Date   Anxiety    COPD (chronic obstructive pulmonary disease) (HCC)    DVT (deep venous thrombosis) (HCC)    multiple, in both upper and lower extremities   Pulmonary embolism (HCC)     Medications:  Previously on apixaban  5 mg bid- last dose 10/6 AM  Assessment: Patient is a 43 year old man with a past medical history of COPD and PE on apixaban . He presented to the ED with epigastric pain radiating to his back as well as decreased appetite. Lipase elevated at 2,472. RUQ US  consistent with cholelithiasis without evidence of acute cholecystitis and MRCP revealed acute interstitial pancreatitis with diffuse pancreatic interstitial edema as well as gallbladder wall edema, no evidence of choledocholithiasis. Pharmacy was consulted to initiate patient on heparin infusion for known PE so patient can be taken to the OR on Wednesday.   Hgb 13.6. PLT 229.  Heparin level >1.1. INR 1.3. aPTT 32.  No signs/symptoms of bleeding in chart.   Goal of Therapy:   Heparin level 0.3-0.7 units/ml aPTT 66-102 seconds Monitor platelets by anticoagulation protocol: Yes   Plan:  - start heparin infusion this evening when next dose of apixaban  is due - will not give initial bolus given recent use of apixaban  - start heparin infusion at a rate of 1300 units/hr - will use aPTT for monitoring until HL correlates - check aPTT level in 6 hours after start of infusion, monitor HL daily - monitor CBC daily while on heparin  Matthew Graves, PharmD, BCPS 03/30/2024,11:54 AM

## 2024-03-30 NOTE — Consult Note (Signed)
 [facility] SURGICAL CONSULTATION NOTE (initial) - cpt: 00756   HISTORY OF PRESENT ILLNESS (HPI):  43 y.o. male presented to [facility] ED yesterday for evaluation of chest pain. Patient reports over the last few weeks he has been having intermittent upper abdominal pain with decrease in appetite. Yesterday he noticed significant worsening of epigastric abdominal pain with radiation through to his back. This was accompanied with nausea. No reported fever, chills, SOB, urinary changes, no r bowel changes. He denied any jaundice. 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 He does report a history of alcohol abuse in the past but only had one beer over the last two weeks. No previous abdominal surgeries. Work up in the ED revealed leukocytosis to 13.7K, Hgb to 17.0, total bilirubin 2.5, lipase 2400. He had RUQ US  concerning for cholelithiasis. MRCP was obtained given hyperbilirubinemia and negative for choledocholithiasis, did note pancreatitis. He was admitted to the medicine service.   Surgery is consulted by emergency medicine physician Dr. Ginnie Shams, MD in this context for evaluation and management of gallstone pancreatitis.  PAST MEDICAL HISTORY (PMH):  Past Medical History:  Diagnosis Date   Anxiety    COPD (chronic obstructive pulmonary disease) (HCC)    DVT (deep venous thrombosis) (HCC)    multiple, in both upper and lower extremities   Pulmonary embolism (HCC)      PAST SURGICAL HISTORY (PSH):  Past Surgical History:  Procedure Laterality Date   FRACTURE SURGERY     pins in fingers     MEDICATIONS:  Prior to Admission medications   Medication Sig Start Date End Date Taking? Authorizing Provider  ALPRAZolam (XANAX) 1 MG tablet Take 1 mg by mouth 2 (two) times daily. 05/01/17  Yes [provider]  APIXABAN  (ELIQUIS ) VTE STARTER PACK (10MG  AND 5MG ) Take as directed on package: start with two-5mg  tablets twice daily for 7 days. On day 8, switch  to one-5mg  tablet twice daily. Patient taking differently: Take 5 mg by mouth 2 (two) times daily. Take as directed on package: start with two-5mg  tablets twice daily for 7 days. On day 8, switch to one-5mg  tablet twice daily. 03/13/24  Yes Viviann Pastor, MD  buprenorphine-naloxone (SUBOXONE) 2-0.5 mg SUBL SL tablet Place 1 tablet under the tongue 2 (two) times daily. 03/20/24  Yes [provider]  Melatonin 12 MG TABS Take 1 tablet by mouth at bedtime.   Yes [provider]  PARoxetine (PAXIL) 10 MG tablet Take 10 mg by mouth daily. 10/05/23  Yes [provider]  REXULTI 1 MG TABS tablet Take 1 mg by mouth at bedtime. 03/06/24  Yes [provider]     ALLERGIES:  Allergies  Allergen Reactions   Spiriva Handihaler [Tiotropium Bromide Monohydrate]      SOCIAL HISTORY:  Social History   Socioeconomic History   Marital status: Divorced    Spouse name: Not on file   Number of children: Not on file   Years of education: Not on file   Highest education level: Not on file  Occupational History   Not on file  Tobacco Use   Smoking status: Never   Smokeless tobacco: Current    Types: Chew  Vaping Use   Vaping status: Never Used  Substance and Sexual Activity   Alcohol use: Yes    Comment: On occasion   Drug use: Not Currently    Types: Marijuana   Sexual activity: Yes  Other Topics Concern  Not on file  Social History Narrative   Not on file   Social Drivers of Health   Financial Resource Strain: Low Risk  (11/23/2022)   Received from [facility]   Overall Financial Resource Strain (CARDIA)    Difficulty of Paying Living Expenses: Not hard at all  Food Insecurity: No Food Insecurity (11/23/2022)   Received from [facility]   Hunger Vital Sign    Within the past 12 months, you worried that your food would run out before you got the money to buy more.: Never true    Within the past 12 months, the food you  bought just didn't last and you didn't have money to get more.: Never true  Transportation Needs: No Transportation Needs (11/23/2022)   Received from [facility] - Transportation    In the past 12 months, has lack of transportation kept you from medical appointments or from getting medications?: No    Lack of Transportation (Non-Medical): No  Physical Activity: Not on file  Stress: Not on file  Social Connections: Not on file  Intimate Partner Violence: Not on file     FAMILY HISTORY:  Family History  Problem Relation Age of Onset   Rheum arthritis Mother    Mental illness Mother        Anxiety   COPD Father    Mental illness Sister        Anxiety   Mental illness Son        Anxiety   Clotting disorder Paternal Grandmother    Clotting disorder Paternal Grandfather       REVIEW OF SYSTEMS:  Review of Systems  Constitutional:  Negative for chills and fever.  Respiratory:  Negative for cough and shortness of breath.   Cardiovascular:  Positive for chest pain. Negative for palpitations.  Gastrointestinal:  Positive for abdominal pain and nausea. Negative for constipation, diarrhea and vomiting.  Genitourinary:  Negative for dysuria and urgency.  All other systems reviewed and are negative.   VITAL SIGNS:  Temp:  [98.2 F (36.8 C)-98.9 F (37.2 C)] 98.2 F (36.8 C) (10/06 0710) Pulse Rate:  [70-85] 77 (10/06 0645) Resp:  [10-24] 16 (10/06 0645) BP: (103-117)/(68-79) 104/77 (10/06 0645) SpO2:  [96 %-99 %] 99 % (10/06 0645) Weight:  [83.9 kg] 83.9 kg (10/05 2025)     Height: 6' 2 (188 cm) Weight: 83.9 kg BMI (Calculated): 23.74   INTAKE/OUTPUT:  10/05 0701 - 10/06 0700 In: 1000 [IV Piggyback:1000] Out: -   PHYSICAL EXAM:  Physical Exam Vitals and nursing note reviewed. Exam conducted with a chaperone present.  Constitutional:      General: He is not in acute distress.    Appearance: Normal appearance. He is normal weight. He is not  ill-appearing.     Comments: Resting in bed; NAD Family at bedside   HENT:     Head: Normocephalic and atraumatic.  Eyes:     General: No scleral icterus.    Conjunctiva/sclera: Conjunctivae normal.  Cardiovascular:     Rate and Rhythm: Normal rate.     Pulses: Normal pulses.     Heart sounds: No murmur heard. Pulmonary:     Effort: Pulmonary effort is normal. No respiratory distress.  Abdominal:     General: Abdomen is flat. There is no distension.     Tenderness: There is abdominal tenderness in the epigastric area. There is no guarding or rebound. Negative signs include Murphy's sign.  Comments: Mild epigastric abdominal tenderness, non-distended, no rebound/guarding. Murphy's Sign negative   Genitourinary:    Comments: Deferred Musculoskeletal:     Right lower leg: No edema.     Left lower leg: No edema.  Skin:    General: Skin is warm and dry.     Coloration: Skin is not jaundiced.  Neurological:     General: No focal deficit present.     Mental Status: He is alert and oriented to person, place, and time.  Psychiatric:        Mood and Affect: Mood normal.        Behavior: Behavior normal.      Labs:     Latest Ref Rng & Units 03/30/2024   10:02 AM 03/29/2024    8:26 PM 03/13/2024    9:24 AM  CBC  WBC 4.0 - 10.5 K/uL 6.5  13.7  7.4   Hemoglobin 13.0 - 17.0 g/dL 86.3  82.9  83.3   Hematocrit 39.0 - 52.0 % 38.1  49.4  48.6   Platelets 150 - 400 K/uL 229  361  223       Latest Ref Rng & Units 03/30/2024   12:54 AM 03/29/2024    8:26 PM 03/13/2024    9:24 AM  CMP  Glucose 70 - 99 mg/dL  862  820   BUN 6 - 20 mg/dL  11  8   Creatinine 9.38 - 1.24 mg/dL  9.13  8.95   Sodium 864 - 145 mmol/L  138  139   Potassium 3.5 - 5.1 mmol/L  4.5  3.8   Chloride 98 - 111 mmol/L  103  100   CO2 22 - 32 mmol/L  23  24   Calcium 8.9 - 10.3 mg/dL  9.2  9.2   Total Protein 6.5 - 8.1 g/dL 5.5     Total Bilirubin 0.0 - 1.2 mg/dL 2.6     Alkaline Phos 38 - 126 U/L 52     AST 15  - 41 U/L 292     ALT 0 - 44 U/L 231       Imaging studies:   RUQ US  (03/30/2024) personally reviewed with cholelithiasis, and radiologist report reviewed below: IMPRESSION: 1. Cholelithiasis without evidence of acute cholecystitis.   MRCP (03/30/2024) personally reviewed without evidence of choledocholithiasis, pancreatitis noted, and radiologist report reviewed below: IMPRESSION: 1. Acute interstitial pancreatitis: diffuse pancreatic interstitial edema without main duct dilatation or mass; no pseudocyst or pancreatic necrosis. 2. Gallstones and gallbladder wall edema. In the setting of acute pancreatitis, gallbladder wall edema is a nonspecific finding. 3. No evidence of choledocholithiasis; common bile duct nondilated.   Assessment/Plan:  43 y.o. male with gallstone pancreatitis, complicated by pertinent comorbidities including history of PE/DVT on Eliquis .   - Appreciate medicine admission   - Okay for CLD as tolerated  - Will tentatively plan for robotic assisted cholecystectomy on Wednesday (10/08) with Dr Jordis  GLENWOOD [place] for heparin gtt; plan to stop morning of 10/08  - Monitor abdominal examination - Pain control prn; antiemetics prn   - Monitor hyperbilirubinemia - Monitor Lipase   - Mobilize as tolerated  - Further management per primary service; we will follow   All of the above findings and recommendations were discussed with the patient and his family, and all of patient's and his family's questions were answered to their expressed satisfaction.  Thank you for the opportunity to participate in this patient's care.   -- Tiger Spieker, PA-C [facility] 03/30/2024, 10:48 AM M-F: 7am - 4pm

## 2024-03-30 NOTE — H&P (Signed)
 History and Physical    Patient: Matthew Graves FMW:[id number] DOB: 28-Jun-1980 DOA: 03/29/2024 DOS: the patient was seen and examined on 03/30/2024 PCP: Pcp, No  Patient coming from: Home  Chief Complaint: abdominal pain  HPI: [name] is a 43 y.o. 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  Denies any fever, chills.  He reports the pain has been progressive over the last 3 days with acute worsening after having a beer yesterday.  Does endorse history of heavy alcohol use but recently has cut back significantly.  Last drink was 1 beer the day prior to arrival, prior to that he had not had anything to drink for 2 weeks.  Labs in the ED revealed lipase of 2472, elevated AST and ALT, total bili 2.6, WBC 13.7.  CXR unremarkable.  RUQ US  consistent with cholelithiasis without evidence of acute cholecystitis.  General surgery was consulted.  MRCP was performed which revealed acute interstitial pancreatitis with diffuse pancreatic interstitial edema as well as gallbladder wall edema, no evidence of choledocholithiasis.  CBD nondilated. Patient was admitted for management of gallstone pancreatitis.  Surgery was consulted.  At the time my evaluation he reports that his pain is already significantly improved.  He reports it is well-controlled with Toradol  and he is endorsing hunger.  His mother and sister are at bedside during our evaluation.  He he consents to discussing medical care in the presence of his mom.   Review of Systems: As mentioned in the history of present illness. All other systems reviewed and are negative. Past Medical History:  Diagnosis Date   Anxiety    COPD (chronic obstructive pulmonary disease) (HCC)    DVT (deep venous thrombosis) (HCC)    multiple, in both upper and lower extremities   Pulmonary embolism (HCC)    Past  Surgical History:  Procedure Laterality Date   FRACTURE SURGERY     pins in fingers   Social History:  reports that he has never smoked. His smokeless tobacco use includes chew. He reports current alcohol use. He reports that he does not currently use drugs after having used the following drugs: Marijuana.  Allergies  Allergen Reactions   Spiriva Handihaler [Tiotropium Bromide Monohydrate]     Family History  Problem Relation Age of Onset   Rheum arthritis Mother    Mental illness Mother        Anxiety   COPD Father    Mental illness Sister        Anxiety   Mental illness Son        Anxiety   Clotting disorder Paternal Grandmother    Clotting disorder Paternal Grandfather     Prior to Admission medications   Medication Sig Start Date End Date Taking? Authorizing Provider  ALPRAZolam (XANAX) 1 MG tablet Take 1 mg by mouth 2 (two) times daily. 05/01/17  Yes [provider]  APIXABAN  (ELIQUIS ) VTE STARTER PACK (10MG  AND 5MG ) Take as directed on package: start with two-5mg  tablets twice daily for 7 days. On day 8, switch to one-5mg  tablet twice daily. Patient taking differently: Take 5 mg by mouth 2 (two) times daily. Take as directed on package: start with two-5mg  tablets twice daily for 7 days. On day 8, switch to one-5mg  tablet twice daily. 03/13/24  Yes Viviann Pastor, MD  buprenorphine-naloxone (SUBOXONE) 2-0.5 mg SUBL SL tablet Place 1  tablet under the tongue 2 (two) times daily. 03/20/24  Yes [provider]  Melatonin 12 MG TABS Take 1 tablet by mouth at bedtime.   Yes [provider]  PARoxetine (PAXIL) 10 MG tablet Take 10 mg by mouth daily. 10/05/23  Yes [provider]  REXULTI 1 MG TABS tablet Take 1 mg by mouth at bedtime. 03/06/24  Yes [provider]  methocarbamol  (ROBAXIN ) 500 MG tablet Take 1 tablet (500 mg total) by mouth every 6 (six) hours as needed for muscle spasms (moderate/severe pain). Patient not taking: Reported  on 03/30/2024 10/06/20   Claudene Rover, MD    Physical Exam: Vitals:   03/30/24 0530 03/30/24 0642 03/30/24 0645 03/30/24 0710  BP: 113/77  104/77   Pulse: 77  77   Resp: (!) 24  16   Temp:  98.9 F (37.2 C)  98.2 F (36.8 C)  TempSrc:  Oral  Oral  SpO2: 97%  99%   Weight:      Height:       Constitutional:  Normal appearance. Non toxic-appearing.  HENT: Head Normocephalic and atraumatic.  Mucous membranes are moist.  Eyes:  Extraocular intact. Conjunctivae normal. Pupils are equal, round, and reactive to light.  Cardiovascular: Rate and Rhythm: Normal rate and regular rhythm.  Pulmonary: Non labored, symmetric rise of chest wall. Abdomen: Nondistended, soft, mildly tender to palpation in epigastrium, no guarding. Musculoskeletal:  Normal range of motion.  Skin: warm and dry. not jaundiced.  Neurological: No focal deficit present. alert. Oriented. Psychiatric: Mood and Affect congruent.   Data Reviewed:     Latest Ref Rng & Units 03/30/2024   10:02 AM 03/29/2024    8:26 PM 03/13/2024    9:24 AM  CBC  WBC 4.0 - 10.5 K/uL 6.5  13.7  7.4   Hemoglobin 13.0 - 17.0 g/dL 86.3  82.9  83.3   Hematocrit 39.0 - 52.0 % 38.1  49.4  48.6   Platelets 150 - 400 K/uL 229  361  223       Latest Ref Rng & Units 03/30/2024   10:02 AM 03/29/2024    8:26 PM 03/13/2024    9:24 AM  BMP  Glucose 70 - 99 mg/dL 92  862  820   BUN 6 - 20 mg/dL 9  11  8    Creatinine 0.61 - 1.24 mg/dL 9.20  9.13  8.95   Sodium 135 - 145 mmol/L 139  138  139   Potassium 3.5 - 5.1 mmol/L 4.0  4.5  3.8   Chloride 98 - 111 mmol/L 103  103  100   CO2 22 - 32 mmol/L 23  23  24    Calcium 8.9 - 10.3 mg/dL 8.7  9.2  9.2     MR 3D Recon At Scanner EXAM: MRCP WITHOUT IV CONTRAST 03/30/2024 02:58:48 AM  TECHNIQUE: Multisequence, multiplanar magnetic resonance images of the abdomen without intravenous contrast. MRCP sequences were performed.  COMPARISON: Right upper quadrant abdominal ultrasound  03/29/2024.  CLINICAL HISTORY: RUQ abdominal pain, biliary disease suspected, US  nondiagnostic.  FINDINGS:  LIVER: Unremarkable.  GALLBLADDER AND BILIARY SYSTEM: Multiple small gallstones are identified measuring up to 3 mm. Gallbladder wall edema is identified measuring up to 6 mm. Common bile duct is nondilated measuring up to 5 mm. No signs of choledocholithiasis. No intrahepatic ductal dilation.  SPLEEN: Unremarkable.  PANCREAS/PANCREATIC DUCT: Diffuse pancreatic interstitial edema identified without main duct dilatation or mass. No focal fluid collections identified to suggest pseudocyst. No  unenhanced findings of pancreatic necrosis.  ADRENAL GLANDS: Unremarkable.  KIDNEYS: Unremarkable.  LYMPH NODES: No enlarged abdominal lymph nodes.  VASCULATURE: Unremarkable.  PERITONEUM: No ascites.  ABDOMINAL WALL: No hernia. No mass.  BOWEL: Grossly unremarkable. No bowel obstruction.  BONES: No acute abnormality or worrisome osseous lesion.  SOFT TISSUES: Unremarkable.  MISCELLANEOUS: Unremarkable.  IMPRESSION: 1. Acute interstitial pancreatitis: diffuse pancreatic interstitial edema without main duct dilatation or mass; no pseudocyst or pancreatic necrosis. 2. Gallstones and gallbladder wall edema. In the setting of acute pancreatitis, gallbladder wall edema is a nonspecific finding. 3. No evidence of choledocholithiasis; common bile duct nondilated.  Electronically signed by: Waddell Calk MD 03/30/2024 05:10 AM EDT RP Workstation: [id number] MR ABDOMEN MRCP WO CONTRAST EXAM: MRCP WITHOUT IV CONTRAST 03/30/2024 02:58:48 AM  TECHNIQUE: Multisequence, multiplanar magnetic resonance images of the abdomen without intravenous contrast. MRCP sequences were performed.  COMPARISON: Right upper quadrant abdominal ultrasound 03/29/2024.  CLINICAL HISTORY: RUQ abdominal pain, biliary disease suspected, US   nondiagnostic.  FINDINGS:  LIVER: Unremarkable.  GALLBLADDER AND BILIARY SYSTEM: Multiple small gallstones are identified measuring up to 3 mm. Gallbladder wall edema is identified measuring up to 6 mm. Common bile duct is nondilated measuring up to 5 mm. No signs of choledocholithiasis. No intrahepatic ductal dilation.  SPLEEN: Unremarkable.  PANCREAS/PANCREATIC DUCT: Diffuse pancreatic interstitial edema identified without main duct dilatation or mass. No focal fluid collections identified to suggest pseudocyst. No unenhanced findings of pancreatic necrosis.  ADRENAL GLANDS: Unremarkable.  KIDNEYS: Unremarkable.  LYMPH NODES: No enlarged abdominal lymph nodes.  VASCULATURE: Unremarkable.  PERITONEUM: No ascites.  ABDOMINAL WALL: No hernia. No mass.  BOWEL: Grossly unremarkable. No bowel obstruction.  BONES: No acute abnormality or worrisome osseous lesion.  SOFT TISSUES: Unremarkable.  MISCELLANEOUS: Unremarkable.  IMPRESSION: 1. Acute interstitial pancreatitis: diffuse pancreatic interstitial edema without main duct dilatation or mass; no pseudocyst or pancreatic necrosis. 2. Gallstones and gallbladder wall edema. In the setting of acute pancreatitis, gallbladder wall edema is a nonspecific finding. 3. No evidence of choledocholithiasis; common bile duct nondilated.  Electronically signed by: Waddell Calk MD 03/30/2024 05:10 AM EDT RP Workstation: [id number]  ABDOMEN LIMITED RUQ (LIVER/GB) EXAM: Right Upper Quadrant Abdominal Ultrasound 03/29/2024 11:27:14 PM  TECHNIQUE: Real-time ultrasonography of the right upper quadrant of the abdomen was performed.  COMPARISON: None available.  CLINICAL HISTORY: Upper abd pain.  FINDINGS:  LIVER: The liver demonstrates normal echogenicity. No intrahepatic biliary ductal dilatation. No evidence of mass.  BILIARY SYSTEM: Multiple gallstones are seen within the gallbladder. The gallbladder is  not distended, there is no gallbladder wall thickening, and no pericholecystic fluid is identified. The sonographic Murphy's sign is negative. Common bile duct measures 4 mm in diameter proximally.  RIGHT KIDNEY: The right kidney is grossly unremarkable in appearances without evidence of hydronephrosis, echogenic calculi or worrisome mass lesions.  OTHER: No right upper quadrant ascites.  IMPRESSION: 1. Cholelithiasis without evidence of acute cholecystitis.  Electronically signed by: Dorethia Molt MD 03/30/2024 12:02 AM EDT RP Workstation: [id number]   Assessment and Plan: Fall some pancreatitis - Likely passed stone, pain is resolving - Trend CMP - MRCP negative for choledocholithiasis - General Surgery consulted and tentatively planning for laparoscopic cholecystectomy on Wednesday - Patient is endorsing hunger with unimpressive abdominal exam.  Will allow him to eat slowly. - Continue IV fluids for now until p.o. intake is consistent - Toradol  as needed for pain per patient request.  He would like to avoid morphine  and continue with home dose Suboxone for now  History of pulmonary embolism  History of DVT - Patient has extensive history of DVT and pulmonary embolisms. - Most recent PE on 9/19 when he was off anticoagulation. - Eliquis  was resumed on admission.  Last dose 10/6 AM.  Given plans for surgery on Wednesday we will transition to heparin for now with plan to hold before OR on 10/8. - Resume Eliquis  when cleared by general surgery.  Suboxone therapy - Patient reports he is on Suboxone to help wean off Xanax which he currently takes for anxiety - Patient has opted to continue taking Suboxone and denies the need for additional opioid therapy for pain - Resume Suboxone at home dose  COPD, not currently in exacerbation - Resume home dose inhalers  Anxiety Depression - Continue home meds, which include alprazolam 1 mg twice daily  Alcohol use - Does not appear  to be consistent or dependent.  1 drink prior to arrival, prior to that no alcohol for 3 weeks - Will monitor closely, but no indication for CIWA at this time unless picture changes - Have counseled extensively on the importance of avoiding alcohol to avoid pancreatitis flares in the future   Advance Care Planning:   Code Status: Full Code   Consults: General Surgery  Family Communication: Mother at bedside for discussion  Severity of Illness: The appropriate patient status for this patient is INPATIENT. Inpatient status is judged to be reasonable and necessary in order to provide the required intensity of service to ensure the patient's safety. The patient's presenting symptoms, physical exam findings, and initial radiographic and laboratory data in the context of their chronic comorbidities is felt to place them at high risk for further clinical deterioration. Furthermore, it is not anticipated that the patient will be medically stable for discharge from the hospital within 2 midnights of admission.   * I certify that at the point of admission it is my clinical judgment that the patient will require inpatient hospital care spanning beyond 2 midnights from the point of admission due to high intensity of service, high risk for further deterioration and high frequency of surveillance required.*  Author: Natividad Schlosser, DO 03/30/2024 8:24 AM  For on call review [email or web address].

## 2024-03-31 DIAGNOSIS — Z79899 Other long term (current) drug therapy: Secondary | ICD-10-CM | POA: Diagnosis not present

## 2024-03-31 DIAGNOSIS — Z832 Family history of diseases of the blood and blood-forming organs and certain disorders involving the immune mechanism: Secondary | ICD-10-CM | POA: Diagnosis not present

## 2024-03-31 DIAGNOSIS — F419 Anxiety disorder, unspecified: Secondary | ICD-10-CM | POA: Diagnosis not present

## 2024-03-31 DIAGNOSIS — Z86718 Personal history of other venous thrombosis and embolism: Secondary | ICD-10-CM | POA: Diagnosis not present

## 2024-03-31 DIAGNOSIS — K858 Other acute pancreatitis without necrosis or infection: Secondary | ICD-10-CM | POA: Diagnosis not present

## 2024-03-31 DIAGNOSIS — Z888 Allergy status to other drugs, medicaments and biological substances status: Secondary | ICD-10-CM | POA: Diagnosis not present

## 2024-03-31 DIAGNOSIS — Z8261 Family history of arthritis: Secondary | ICD-10-CM | POA: Diagnosis not present

## 2024-03-31 DIAGNOSIS — E876 Hypokalemia: Secondary | ICD-10-CM | POA: Diagnosis not present

## 2024-03-31 DIAGNOSIS — K859 Acute pancreatitis without necrosis or infection, unspecified: Secondary | ICD-10-CM | POA: Diagnosis not present

## 2024-03-31 DIAGNOSIS — Z825 Family history of asthma and other chronic lower respiratory diseases: Secondary | ICD-10-CM | POA: Diagnosis not present

## 2024-03-31 DIAGNOSIS — Z7901 Long term (current) use of anticoagulants: Secondary | ICD-10-CM | POA: Diagnosis not present

## 2024-03-31 DIAGNOSIS — F17220 Nicotine dependence, chewing tobacco, uncomplicated: Secondary | ICD-10-CM | POA: Diagnosis not present

## 2024-03-31 DIAGNOSIS — F32A Depression, unspecified: Secondary | ICD-10-CM | POA: Diagnosis not present

## 2024-03-31 DIAGNOSIS — K851 Biliary acute pancreatitis without necrosis or infection: Secondary | ICD-10-CM | POA: Diagnosis not present

## 2024-03-31 DIAGNOSIS — Z86711 Personal history of pulmonary embolism: Secondary | ICD-10-CM | POA: Diagnosis not present

## 2024-03-31 DIAGNOSIS — Z818 Family history of other mental and behavioral disorders: Secondary | ICD-10-CM | POA: Diagnosis not present

## 2024-03-31 DIAGNOSIS — K8010 Calculus of gallbladder with chronic cholecystitis without obstruction: Secondary | ICD-10-CM | POA: Diagnosis not present

## 2024-03-31 DIAGNOSIS — J449 Chronic obstructive pulmonary disease, unspecified: Secondary | ICD-10-CM | POA: Diagnosis not present

## 2024-03-31 DIAGNOSIS — G8929 Other chronic pain: Secondary | ICD-10-CM | POA: Diagnosis not present

## 2024-03-31 LAB — CBC
HCT: 38.2 % — ABNORMAL LOW (ref 39.0–52.0)
Hemoglobin: 13.3 g/dL (ref 13.0–17.0)
MCH: 31.7 pg (ref 26.0–34.0)
MCHC: 34.8 g/dL (ref 30.0–36.0)
MCV: 91.2 fL (ref 80.0–100.0)
Platelets: 216 K/uL (ref 150–400)
RBC: 4.19 MIL/uL — ABNORMAL LOW (ref 4.22–5.81)
RDW: 12 % (ref 11.5–15.5)
WBC: 4.9 K/uL (ref 4.0–10.5)
nRBC: 0 % (ref 0.0–0.2)

## 2024-03-31 LAB — LIPASE, BLOOD: Lipase: 48 U/L (ref 11–51)

## 2024-03-31 LAB — APTT
aPTT: 81 s — ABNORMAL HIGH (ref 24–36)
aPTT: 109 s — ABNORMAL HIGH (ref 24–36)
aPTT: 84 s — ABNORMAL HIGH (ref 24–36)

## 2024-03-31 LAB — HEPARIN LEVEL (UNFRACTIONATED): Heparin Unfractionated: 0.96 [IU]/mL — ABNORMAL HIGH (ref 0.30–0.70)

## 2024-03-31 MED ORDER — CEFAZOLIN SODIUM-DEXTROSE 2-4 GM/100ML-% IV SOLN
2.0000 g | INTRAVENOUS | Status: AC
  Administered 2024-04-01: 2 g via INTRAVENOUS
  Filled 2024-03-31: qty 100

## 2024-03-31 MED ORDER — INDOCYANINE GREEN 25 MG IV SOLR
1.2500 mg | INTRAVENOUS | Status: AC
  Administered 2024-04-01: 1.25 mg via INTRAVENOUS
  Filled 2024-03-31: qty 10

## 2024-03-31 NOTE — Progress Notes (Signed)
 PHARMACY - ANTICOAGULATION CONSULT NOTE  Pharmacy Consult for Heparin Infusion Indication: pulmonary embolus  Allergies  Allergen Reactions   Spiriva Handihaler [Tiotropium Bromide Monohydrate]     Patient Measurements: Height: 6' 2 (188 cm) Weight: 83.9 kg (185 lb) IBW/kg (Calculated) : 82.2 HEPARIN DW (KG): 83.9  Vital Signs: Temp: 97.7 F (36.5 C) (10/07 1605) Temp Source: Oral (10/07 1605) BP: 111/84 (10/07 1605) Pulse Rate: 63 (10/07 1605)  Labs: Recent Labs    03/29/24 2026 03/30/24 0054 03/30/24 1002 03/30/24 1117 03/30/24 1117 03/31/24 0316 03/31/24 0922 03/31/24 1759  HGB 17.0  --  13.6  --   --  13.3  --   --   HCT 49.4  --  38.1*  --   --  38.2*  --   --   PLT 361  --  229  --   --  216  --   --   APTT  --   --   --  32   < > 81* 109* 84*  LABPROT  --   --   --  17.4*  --   --   --   --   INR  --   --   --  1.3*  --   --   --   --   HEPARINUNFRC  --   --   --  >1.10*  --  0.96*  --   --   CREATININE 0.86  --  0.79  --   --   --   --   --   TROPONINIHS 3 4  --   --   --   --   --   --    < > = values in this interval not displayed.    Estimated Creatinine Clearance: 135.6 mL/min (by C-G formula based on SCr of 0.79 mg/dL).   Medical History: Past Medical History:  Diagnosis Date   Anxiety    COPD (chronic obstructive pulmonary disease) (HCC)    DVT (deep venous thrombosis) (HCC)    multiple, in both upper and lower extremities   Pulmonary embolism (HCC)     Medications:  Previously on apixaban  5 mg bid- last dose 10/6 AM  Assessment: Patient is a 43 year old man with a past medical history of COPD and PE on apixaban . He presented to the ED with epigastric pain radiating to his back as well as decreased appetite. Lipase elevated at 2,472. RUQ US  consistent with cholelithiasis without evidence of acute cholecystitis and MRCP revealed acute interstitial pancreatitis with diffuse pancreatic interstitial edema as well as gallbladder wall edema, no  evidence of choledocholithiasis. Pharmacy was consulted to initiate patient on heparin infusion for known PE so patient can be taken to the OR on Wednesday.   Hgb 13.6. PLT 229.  Heparin level >1.1. INR 1.3. aPTT 32.  No signs/symptoms of bleeding in chart.   Goal of Therapy:  Heparin level 0.3-0.7 units/ml aPTT 66-102 seconds Monitor platelets by anticoagulation protocol: Yes   10/7 @ 0316:   aPTT = 81,    HL = 0.96 10/7 0922 aPTT 109,  SUPRAtherapeutic 10/7 1759 aPTT: 84, therapeutic x 1    Plan:  - will continue current heparin gtt rate at 1200 units/hr - recheck aPTT in 6 hrs - recheck HL on 10/8 with AM labs - will use aPTT for monitoring until HL correlates - monitor CBC daily while on heparin   Ransom Blanch PGY-1 Pharmacy Resident  [facility]  03/31/2024  6:58 PM

## 2024-03-31 NOTE — TOC CM/SW Note (Signed)
 Transition of Care [facility]) CM/SW Note    Transition of Care [facility]) - Inpatient Brief Assessment   Patient Details  Name: Matthew Graves MRN: [id number] Date of Birth: Jul 16, 1980  Transition of Care [facility]) CM/SW Contact:    Alfonso Rummer, LCSW Phone Number: 03/31/2024, 1:50 PM   Clinical Narrative:  LCSW A. Laberta Wilbon added pcp list to AVS. No additional TOC needs identified presently please contact should needs arise.   Transition of Care Asessment:

## 2024-03-31 NOTE — Progress Notes (Addendum)
 PHARMACY - ANTICOAGULATION CONSULT NOTE  Pharmacy Consult for Heparin Infusion Indication: pulmonary embolus  Allergies  Allergen Reactions   Spiriva Handihaler [Tiotropium Bromide Monohydrate]     Patient Measurements: Height: 6' 2 (188 cm) Weight: 83.9 kg (185 lb) IBW/kg (Calculated) : 82.2 HEPARIN DW (KG): 83.9  Vital Signs: Temp: 98.6 F (37 C) (10/07 0251) BP: 121/81 (10/07 0251) Pulse Rate: 68 (10/07 0251)  Labs: Recent Labs    03/29/24 2026 03/30/24 0054 03/30/24 1002 03/30/24 1117 03/31/24 0316  HGB 17.0  --  13.6  --  13.3  HCT 49.4  --  38.1*  --  38.2*  PLT 361  --  229  --  216  APTT  --   --   --  32 81*  LABPROT  --   --   --  17.4*  --   INR  --   --   --  1.3*  --   HEPARINUNFRC  --   --   --  >1.10* 0.96*  CREATININE 0.86  --  0.79  --   --   TROPONINIHS 3 4  --   --   --     Estimated Creatinine Clearance: 135.6 mL/min (by C-G formula based on SCr of 0.79 mg/dL).   Medical History: Past Medical History:  Diagnosis Date   Anxiety    COPD (chronic obstructive pulmonary disease) (HCC)    DVT (deep venous thrombosis) (HCC)    multiple, in both upper and lower extremities   Pulmonary embolism (HCC)     Medications:  Previously on apixaban  5 mg bid- last dose 10/6 AM  Assessment: Patient is a 43 year old man with a past medical history of COPD and PE on apixaban . He presented to the ED with epigastric pain radiating to his back as well as decreased appetite. Lipase elevated at 2,472. RUQ US  consistent with cholelithiasis without evidence of acute cholecystitis and MRCP revealed acute interstitial pancreatitis with diffuse pancreatic interstitial edema as well as gallbladder wall edema, no evidence of choledocholithiasis. Pharmacy was consulted to initiate patient on heparin infusion for known PE so patient can be taken to the OR on Wednesday.   Hgb 13.6. PLT 229.  Heparin level >1.1. INR 1.3. aPTT 32.  No signs/symptoms of bleeding in chart.    Goal of Therapy:  Heparin level 0.3-0.7 units/ml aPTT 66-102 seconds Monitor platelets by anticoagulation protocol: Yes   10/7 @ 0316:   aPTT = 81,    HL = 0.96 10/7 0922 aPTT 109,  SUPRAtherapeutic   Plan:  10/7 9077 aPTT 109,  SUPRAtherapeutic - will decrease heparin drip to 1200 units/hr and recheck aPTT in 6 hrs - recheck HL on 10/8 with AM labs - will use aPTT for monitoring until HL correlates - monitor CBC daily while on heparin  Lemond Griffee PharmD Clinical Pharmacist 03/31/2024

## 2024-03-31 NOTE — Progress Notes (Signed)
 CC: gallstone pancreatitis Subjective: Feeling better, tolerated food, no n/v  Objective: Vital signs in last 24 hours: Temp:  [98 F (36.7 C)-99 F (37.2 C)] 98 F (36.7 C) (10/07 0743) Pulse Rate:  [65-79] 65 (10/07 0743) Resp:  [16] 16 (10/07 0743) BP: (101-124)/(69-82) 105/73 (10/07 0743) SpO2:  [95 %-100 %] 97 % (10/07 0743) Last BM Date : 03/30/24  Intake/Output from previous day: 10/06 0701 - 10/07 0700 In: 521.8 [I.V.:521.8] Out: -  Intake/Output this shift: No intake/output data recorded.  Physical exam:  Abdomen is soft, non-tender, non-distended  Lab Results: CBC  Recent Labs    03/30/24 1002 03/31/24 0316  WBC 6.5 4.9  HGB 13.6 13.3  HCT 38.1* 38.2*  PLT 229 216   BMET Recent Labs    03/29/24 2026 03/30/24 1002  NA 138 139  K 4.5 4.0  CL 103 103  CO2 23 23  GLUCOSE 137* 92  BUN 11 9  CREATININE 0.86 0.79  CALCIUM 9.2 8.7*   PT/INR Recent Labs    03/30/24 1117  LABPROT 17.4*  INR 1.3*   ABG No results for input(s): PHART, HCO3 in the last 72 hours.  Invalid input(s): PCO2, PO2  Studies/Results: MR ABDOMEN MRCP WO CONTRAST Result Date: 03/30/2024 EXAM: MRCP WITHOUT IV CONTRAST 03/30/2024 02:58:48 AM TECHNIQUE: Multisequence, multiplanar magnetic resonance images of the abdomen without intravenous contrast. MRCP sequences were performed. COMPARISON: Right upper quadrant abdominal ultrasound 03/29/2024. CLINICAL HISTORY: RUQ abdominal pain, biliary disease suspected, US  nondiagnostic. FINDINGS: LIVER: Unremarkable. GALLBLADDER AND BILIARY SYSTEM: Multiple small gallstones are identified measuring up to 3 mm. Gallbladder wall edema is identified measuring up to 6 mm. Common bile duct is nondilated measuring up to 5 mm. No signs of choledocholithiasis. No intrahepatic ductal dilation. SPLEEN: Unremarkable. PANCREAS/PANCREATIC DUCT: Diffuse pancreatic interstitial edema identified without main duct dilatation or mass. No focal fluid  collections identified to suggest pseudocyst. No unenhanced findings of pancreatic necrosis. ADRENAL GLANDS: Unremarkable. KIDNEYS: Unremarkable. LYMPH NODES: No enlarged abdominal lymph nodes. VASCULATURE: Unremarkable. PERITONEUM: No ascites. ABDOMINAL WALL: No hernia. No mass. BOWEL: Grossly unremarkable. No bowel obstruction. BONES: No acute abnormality or worrisome osseous lesion. SOFT TISSUES: Unremarkable. MISCELLANEOUS: Unremarkable. IMPRESSION: 1. Acute interstitial pancreatitis: diffuse pancreatic interstitial edema without main duct dilatation or mass; no pseudocyst or pancreatic necrosis. 2. Gallstones and gallbladder wall edema. In the setting of acute pancreatitis, gallbladder wall edema is a nonspecific finding. 3. No evidence of choledocholithiasis; common bile duct nondilated. Electronically signed by: Waddell Calk MD 03/30/2024 05:10 AM EDT RP Workstation: [id number]   MR 3D Recon At Scanner Result Date: 03/30/2024 EXAM: MRCP WITHOUT IV CONTRAST 03/30/2024 02:58:48 AM TECHNIQUE: Multisequence, multiplanar magnetic resonance images of the abdomen without intravenous contrast. MRCP sequences were performed. COMPARISON: Right upper quadrant abdominal ultrasound 03/29/2024. CLINICAL HISTORY: RUQ abdominal pain, biliary disease suspected, US  nondiagnostic. FINDINGS: LIVER: Unremarkable. GALLBLADDER AND BILIARY SYSTEM: Multiple small gallstones are identified measuring up to 3 mm. Gallbladder wall edema is identified measuring up to 6 mm. Common bile duct is nondilated measuring up to 5 mm. No signs of choledocholithiasis. No intrahepatic ductal dilation. SPLEEN: Unremarkable. PANCREAS/PANCREATIC DUCT: Diffuse pancreatic interstitial edema identified without main duct dilatation or mass. No focal fluid collections identified to suggest pseudocyst. No unenhanced findings of pancreatic necrosis. ADRENAL GLANDS: Unremarkable. KIDNEYS: Unremarkable. LYMPH NODES: No enlarged abdominal lymph nodes.  VASCULATURE: Unremarkable. PERITONEUM: No ascites. ABDOMINAL WALL: No hernia. No mass. BOWEL: Grossly unremarkable. No bowel obstruction. BONES: No acute abnormality or worrisome osseous lesion. SOFT TISSUES:  Unremarkable. MISCELLANEOUS: Unremarkable. IMPRESSION: 1. Acute interstitial pancreatitis: diffuse pancreatic interstitial edema without main duct dilatation or mass; no pseudocyst or pancreatic necrosis. 2. Gallstones and gallbladder wall edema. In the setting of acute pancreatitis, gallbladder wall edema is a nonspecific finding. 3. No evidence of choledocholithiasis; common bile duct nondilated. Electronically signed by: Waddell Calk MD 03/30/2024 05:10 AM EDT RP Workstation: [id number]   US  ABDOMEN LIMITED RUQ (LIVER/GB) Result Date: 03/30/2024 EXAM: Right Upper Quadrant Abdominal Ultrasound 03/29/2024 11:27:14 PM TECHNIQUE: Real-time ultrasonography of the right upper quadrant of the abdomen was performed. COMPARISON: None available. CLINICAL HISTORY: Upper abd pain. FINDINGS: LIVER: The liver demonstrates normal echogenicity. No intrahepatic biliary ductal dilatation. No evidence of mass. BILIARY SYSTEM: Multiple gallstones are seen within the gallbladder. The gallbladder is not distended, there is no gallbladder wall thickening, and no pericholecystic fluid is identified. The sonographic Murphy's sign is negative. Common bile duct measures 4 mm in diameter proximally. RIGHT KIDNEY: The right kidney is grossly unremarkable in appearances without evidence of hydronephrosis, echogenic calculi or worrisome mass lesions. OTHER: No right upper quadrant ascites. IMPRESSION: 1. Cholelithiasis without evidence of acute cholecystitis. Electronically signed by: Dorethia Molt MD 03/30/2024 12:02 AM EDT RP Workstation: [id number]   DG Chest 2 View Result Date: 03/29/2024 EXAM: 2 VIEW(S) XRAY OF THE CHEST 03/29/2024 09:17:00 PM COMPARISON: 03/13/2024 CLINICAL HISTORY: CP/SOB. Pt states 2 weeks ago was dx with  PE. Tonight at 1730 developed CP and SHOB. Pt states pain to center of his chest. Pt also c/o lower back pain. FINDINGS: LUNGS AND PLEURA: No focal pulmonary opacity. No pulmonary edema. No pleural effusion. No pneumothorax. HEART AND MEDIASTINUM: No acute abnormality of the cardiac and mediastinal silhouettes. BONES AND SOFT TISSUES: No acute osseous abnormality. IMPRESSION: 1. Normal chest radiograph. No acute cardiopulmonary process. Electronically signed by: Franky Stanford MD 03/29/2024 09:46 PM EDT RP Workstation: [id number]    Anti-infectives: Anti-infectives (From admission, onward)    Start     Dose/Rate Route Frequency Ordered Stop   04/01/24 0600  ceFAZolin (ANCEF) IVPB 2g/100 mL premix        2 g 200 mL/hr over 30 Minutes Intravenous On call to O.R. 03/31/24 [id number] 04/02/24 0559       Assessment/Plan:  Patient with gallstone pancreatitis, pain has resolved. Okay for diet today. NPO at midnight and plan for robo chole on 10/8. Ancef on call, please hold heparin gtt at 0800 on 10/8.   Jayson Endow, M.D. [facility]

## 2024-03-31 NOTE — Plan of Care (Signed)
   Problem: Education: Goal: Knowledge of General Education information will improve Description: Including pain rating scale, medication(s)/side effects and non-pharmacologic comfort measures Outcome: Progressing   Problem: Clinical Measurements: Goal: Ability to maintain clinical measurements within normal limits will improve Outcome: Progressing Goal: Will remain free from infection Outcome: Progressing

## 2024-03-31 NOTE — Progress Notes (Signed)
 PROGRESS NOTE    Matthew Graves  FMW:[id number] DOB: 12-04-1980 DOA: 03/29/2024 PCP: Pcp, No  Chief Complaint  Patient presents with   Chest Pain    Hospital Course:  Matthew Graves is a 43 y.o. 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  Denies any fever, chills.  He reports the pain has been progressive over the last 3 days with acute worsening after having a beer yesterday.  Does endorse history of heavy alcohol use but recently has cut back significantly.  Last drink was 1 beer the day prior to arrival, prior to that he had not had anything to drink for 2 weeks. Labs in the ED revealed lipase of 2472, elevated AST and ALT, total bili 2.6, WBC 13.7.  CXR unremarkable.  RUQ US  consistent with cholelithiasis without evidence of acute cholecystitis.  General surgery was consulted.  MRCP was performed which revealed acute interstitial pancreatitis with diffuse pancreatic interstitial edema as well as gallbladder wall edema, no evidence of choledocholithiasis.  CBD nondilated. Patient was admitted for management of gallstone pancreatitis.  Surgery was consulted.    Subjective: No acute events overnight.  Patient has been tolerating small amounts of food throughout the day today.  He reports his pain is mild   Objective: Vitals:   03/30/24 2008 03/31/24 0251 03/31/24 0743 03/31/24 1605  BP: 124/82 121/81 105/73 111/84  Pulse: 68 68 65 63  Resp: 16 16 16 16   Temp: 99 F (37.2 C) 98.6 F (37 C) 98 F (36.7 C) 97.7 F (36.5 C)  TempSrc:    Oral  SpO2: 96% 98% 97% 98%  Weight:      Height:        Intake/Output Summary (Last 24 hours) at 03/31/2024 1709 Last data filed at 03/31/2024 1516 Gross per 24 hour  Intake 631.41 ml  Output --  Net 631.41 ml   Filed Weights   03/29/24 2025  Weight: 83.9 kg    Examination: General exam:  Appears calm and comfortable, NAD Respiratory system: No work of breathing, symmetric chest wall expansion Cardiovascular system: S1 & S2 heard, RRR.  Gastrointestinal system: Abdomen is nondistended, soft and very mildly tender to palpation in epigastrium.  No guarding. Neuro: Alert and oriented. No focal neurological deficits. Extremities: Symmetric, expected ROM Skin: No rashes, lesions Psychiatry: Demonstrates appropriate judgement and insight. Mood & affect appropriate for situation.   Assessment & Plan:  Principal Problem:   Acute gallstone pancreatitis Active Problems:   Pancreatitis   Gallstone pancreatitis - Likely passed stone, pain is resolving.  Lipase resulted normal - Trend CMP - MRCP negative for choledocholithiasis - General Surgery consulted and tentatively planning for laparoscopic cholecystectomy on Wednesday - Patient tolerating food - N.p.o. at midnight - Discontinue IV fluids for now - Toradol  as needed for pain per patient request.  He would like to avoid morphine  and continue with home dose Suboxone for now   History of pulmonary embolism  History of DVT - Patient has extensive history of DVT and pulmonary embolisms. - Most recent PE on 9/19 when he was off anticoagulation. - Eliquis  was resumed on admission.  Last dose 10/6 AM.  Transition to heparin drip with plan to hold before OR on 10/8. - Resume Eliquis  when cleared by general surgery.   Suboxone therapy - Patient reports he is on Suboxone to help wean off  Xanax which he currently takes for anxiety - Patient has opted to continue taking Suboxone and denies the need for additional opioid therapy for pain - Resume Suboxone at home dose   COPD, not currently in exacerbation - Resume home dose inhalers   Anxiety Depression - Continue home meds, which include alprazolam 1 mg twice daily   Alcohol use - Does not appear to be consistent or dependent.  1 drink prior to arrival, prior to that no  alcohol for 3 weeks - Will monitor closely, but no indication for CIWA at this time unless picture changes - Have counseled extensively on the importance of avoiding alcohol to avoid pancreatitis flares in the future   DVT prophylaxis: Heparin   Code Status: Full Code Disposition: Inpatient pending OR in a.m.  Consultants:  Treatment Team:  Consulting Physician: Jordis Laneta FALCON, MD  Procedures:    Antimicrobials:  Anti-infectives (From admission, onward)    Start     Dose/Rate Route Frequency Ordered Stop   04/01/24 0600  ceFAZolin (ANCEF) IVPB 2g/100 mL premix        2 g 200 mL/hr over 30 Minutes Intravenous On call to O.R. 03/31/24 0858 04/02/24 0559       Data Reviewed: I have personally reviewed following labs and imaging studies CBC: Recent Labs  Lab 03/29/24 2026 03/30/24 1002 03/31/24 0316  WBC 13.7* 6.5 4.9  NEUTROABS  --  5.0  --   HGB 17.0 13.6 13.3  HCT 49.4 38.1* 38.2*  MCV 91.1 89.0 91.2  PLT 361 229 216   Basic Metabolic Panel: Recent Labs  Lab 03/29/24 2026 03/30/24 1002  NA 138 139  K 4.5 4.0  CL 103 103  CO2 23 23  GLUCOSE 137* 92  BUN 11 9  CREATININE 0.86 0.79  CALCIUM 9.2 8.7*   GFR: Estimated Creatinine Clearance: 135.6 mL/min (by C-G formula based on SCr of 0.79 mg/dL). Liver Function Tests: Recent Labs  Lab 03/30/24 0054 03/30/24 1002  AST 292* 228*  ALT 231* 253*  ALKPHOS 52 57  BILITOT 2.6* 4.5*  PROT 5.5* 5.5*  ALBUMIN 3.1* 3.1*   CBG: No results for input(s): GLUCAP in the last 168 hours.  No results found for this or any previous visit (from the past 240 hours).   Radiology Studies: MR ABDOMEN MRCP WO CONTRAST Result Date: 03/30/2024 EXAM: MRCP WITHOUT IV CONTRAST 03/30/2024 02:58:48 AM TECHNIQUE: Multisequence, multiplanar magnetic resonance images of the abdomen without intravenous contrast. MRCP sequences were performed. COMPARISON: Right upper quadrant abdominal ultrasound 03/29/2024. CLINICAL HISTORY: RUQ  abdominal pain, biliary disease suspected, US  nondiagnostic. FINDINGS: LIVER: Unremarkable. GALLBLADDER AND BILIARY SYSTEM: Multiple small gallstones are identified measuring up to 3 mm. Gallbladder wall edema is identified measuring up to 6 mm. Common bile duct is nondilated measuring up to 5 mm. No signs of choledocholithiasis. No intrahepatic ductal dilation. SPLEEN: Unremarkable. PANCREAS/PANCREATIC DUCT: Diffuse pancreatic interstitial edema identified without main duct dilatation or mass. No focal fluid collections identified to suggest pseudocyst. No unenhanced findings of pancreatic necrosis. ADRENAL GLANDS: Unremarkable. KIDNEYS: Unremarkable. LYMPH NODES: No enlarged abdominal lymph nodes. VASCULATURE: Unremarkable. PERITONEUM: No ascites. ABDOMINAL WALL: No hernia. No mass. BOWEL: Grossly unremarkable. No bowel obstruction. BONES: No acute abnormality or worrisome osseous lesion. SOFT TISSUES: Unremarkable. MISCELLANEOUS: Unremarkable. IMPRESSION: 1. Acute interstitial pancreatitis: diffuse pancreatic interstitial edema without main duct dilatation or mass; no pseudocyst or pancreatic necrosis. 2. Gallstones and gallbladder wall edema. In the setting of acute pancreatitis, gallbladder wall edema is a nonspecific  finding. 3. No evidence of choledocholithiasis; common bile duct nondilated. Electronically signed by: Waddell Calk MD 03/30/2024 05:10 AM EDT RP Workstation: [id number]   MR 3D Recon At Scanner Result Date: 03/30/2024 EXAM: MRCP WITHOUT IV CONTRAST 03/30/2024 02:58:48 AM TECHNIQUE: Multisequence, multiplanar magnetic resonance images of the abdomen without intravenous contrast. MRCP sequences were performed. COMPARISON: Right upper quadrant abdominal ultrasound 03/29/2024. CLINICAL HISTORY: RUQ abdominal pain, biliary disease suspected, US  nondiagnostic. FINDINGS: LIVER: Unremarkable. GALLBLADDER AND BILIARY SYSTEM: Multiple small gallstones are identified measuring up to 3 mm. Gallbladder  wall edema is identified measuring up to 6 mm. Common bile duct is nondilated measuring up to 5 mm. No signs of choledocholithiasis. No intrahepatic ductal dilation. SPLEEN: Unremarkable. PANCREAS/PANCREATIC DUCT: Diffuse pancreatic interstitial edema identified without main duct dilatation or mass. No focal fluid collections identified to suggest pseudocyst. No unenhanced findings of pancreatic necrosis. ADRENAL GLANDS: Unremarkable. KIDNEYS: Unremarkable. LYMPH NODES: No enlarged abdominal lymph nodes. VASCULATURE: Unremarkable. PERITONEUM: No ascites. ABDOMINAL WALL: No hernia. No mass. BOWEL: Grossly unremarkable. No bowel obstruction. BONES: No acute abnormality or worrisome osseous lesion. SOFT TISSUES: Unremarkable. MISCELLANEOUS: Unremarkable. IMPRESSION: 1. Acute interstitial pancreatitis: diffuse pancreatic interstitial edema without main duct dilatation or mass; no pseudocyst or pancreatic necrosis. 2. Gallstones and gallbladder wall edema. In the setting of acute pancreatitis, gallbladder wall edema is a nonspecific finding. 3. No evidence of choledocholithiasis; common bile duct nondilated. Electronically signed by: Waddell Calk MD 03/30/2024 05:10 AM EDT RP Workstation: [id number]   US  ABDOMEN LIMITED RUQ (LIVER/GB) Result Date: 03/30/2024 EXAM: Right Upper Quadrant Abdominal Ultrasound 03/29/2024 11:27:14 PM TECHNIQUE: Real-time ultrasonography of the right upper quadrant of the abdomen was performed. COMPARISON: None available. CLINICAL HISTORY: Upper abd pain. FINDINGS: LIVER: The liver demonstrates normal echogenicity. No intrahepatic biliary ductal dilatation. No evidence of mass. BILIARY SYSTEM: Multiple gallstones are seen within the gallbladder. The gallbladder is not distended, there is no gallbladder wall thickening, and no pericholecystic fluid is identified. The sonographic Murphy's sign is negative. Common bile duct measures 4 mm in diameter proximally. RIGHT KIDNEY: The right kidney  is grossly unremarkable in appearances without evidence of hydronephrosis, echogenic calculi or worrisome mass lesions. OTHER: No right upper quadrant ascites. IMPRESSION: 1. Cholelithiasis without evidence of acute cholecystitis. Electronically signed by: Dorethia Molt MD 03/30/2024 12:02 AM EDT RP Workstation: [id number]   DG Chest 2 View Result Date: 03/29/2024 EXAM: 2 VIEW(S) XRAY OF THE CHEST 03/29/2024 09:17:00 PM COMPARISON: 03/13/2024 CLINICAL HISTORY: CP/SOB. Pt states 2 weeks ago was dx with PE. Tonight at 1730 developed CP and SHOB. Pt states pain to center of his chest. Pt also c/o lower back pain. FINDINGS: LUNGS AND PLEURA: No focal pulmonary opacity. No pulmonary edema. No pleural effusion. No pneumothorax. HEART AND MEDIASTINUM: No acute abnormality of the cardiac and mediastinal silhouettes. BONES AND SOFT TISSUES: No acute osseous abnormality. IMPRESSION: 1. Normal chest radiograph. No acute cardiopulmonary process. Electronically signed by: Franky Stanford MD 03/29/2024 09:46 PM EDT RP Workstation: [id number]    Scheduled Meds:  ALPRAZolam  1 mg Oral BID   brexpiprazole  1 mg Oral QHS   buprenorphine-naloxone  1 tablet Sublingual BID   [START ON 04/01/2024] indocyanine green  1.25 mg Intravenous On Call to OR   ketorolac   15 mg Intravenous Q8H   melatonin  2.5 mg Oral QHS   PARoxetine  10 mg Oral Daily   Continuous Infusions:  [START ON 04/01/2024]  ceFAZolin (ANCEF) IV     heparin 1,200 Units/hr (03/31/24 1516)  LOS: 0 days  MDM: Patient is high risk for one or more organ failure.  They necessitate ongoing hospitalization for continued IV therapies and subsequent lab monitoring. Total time spent interpreting labs and vitals, reviewing the medical record, coordinating care amongst consultants and care team members, directly assessing and discussing care with the patient and/or family: 55 min Matthew Jarnigan, DO [facility]  To contact the attending physician between  7A-7P please use Epic Chat. To contact the covering physician during after hours 7P-7A, please review Amion.  03/31/2024, 5:09 PM   *This document has been created with the assistance of dictation software. Please excuse typographical errors. *

## 2024-03-31 NOTE — Discharge Instructions (Signed)
 Some PCP options in [place] area- not a comprehensive list  [facility]- [phone] [facility]- [phone] [facility]- [phone] [facility]- [phone] [facility]- [phone] Nichole Molly- [phone]  or [facility] Referral Line [phone] Laparoscopic Cholecystectomy, Care After   These instructions give you information on caring for yourself after your procedure. Your doctor may also give you more specific instructions. Call your doctor if you have any problems or questions after your procedure.  HOME CARE  Change your bandages (dressings) as told by your doctor.  Keep the wound dry and clean. Wash the wound gently with soap and water. Pat the wound dry with a clean towel.  Do not take baths, swim, or use hot tubs for 2 weeks, or as told by your doctor.  Only take medicine as told by your doctor.  Eat a normal diet as told by your doctor.  Do not lift anything heavier than 10 pounds (4.5 kg) until your doctor says it is okay.  Do not play contact sports for 1 week, or as told by your doctor. GET HELP IF:  Your wound is red, puffy (swollen), or painful.  You have yellowish-white fluid (pus) coming from the wound.  You have fluid draining from the wound for more than 1 day.  You have a bad smell coming from the wound.  Your wound breaks open. GET HELP RIGHT AWAY IF:  You have trouble breathing.  You have chest pain.  You have a fever >101  You have pain in the shoulders (shoulder strap areas) that is getting worse.  You feel dizzy or pass out (faint).  You have severe belly (abdominal) pain.  You feel sick to your stomach (nauseous) or throw up (vomit) for more than 1 day.

## 2024-04-01 ENCOUNTER — Inpatient Hospital Stay: Admitting: Anesthesiology

## 2024-04-01 ENCOUNTER — Other Ambulatory Visit: Payer: Self-pay

## 2024-04-01 ENCOUNTER — Encounter: Payer: Self-pay | Admitting: Internal Medicine

## 2024-04-01 ENCOUNTER — Encounter
Admission: EM | Disposition: A | Discharge status: Home / Self Care | Payer: Self-pay | Source: Home / Self Care | Attending: Family Medicine

## 2024-04-01 DIAGNOSIS — K8010 Calculus of gallbladder with chronic cholecystitis without obstruction: Secondary | ICD-10-CM | POA: Diagnosis not present

## 2024-04-01 DIAGNOSIS — F418 Other specified anxiety disorders: Secondary | ICD-10-CM | POA: Diagnosis not present

## 2024-04-01 DIAGNOSIS — J449 Chronic obstructive pulmonary disease, unspecified: Secondary | ICD-10-CM | POA: Diagnosis not present

## 2024-04-01 DIAGNOSIS — K851 Biliary acute pancreatitis without necrosis or infection: Secondary | ICD-10-CM | POA: Diagnosis not present

## 2024-04-01 LAB — CBC WITH DIFFERENTIAL/PLATELET
Abs Immature Granulocytes: 0.02 K/uL (ref 0.00–0.07)
Basophils Absolute: 0 K/uL (ref 0.0–0.1)
Basophils Relative: 1 %
Eosinophils Absolute: 0.2 K/uL (ref 0.0–0.5)
Eosinophils Relative: 3 %
HCT: 41.5 % (ref 39.0–52.0)
Hemoglobin: 14.4 g/dL (ref 13.0–17.0)
Immature Granulocytes: 0 %
Lymphocytes Relative: 43 %
Lymphs Abs: 2.1 K/uL (ref 0.7–4.0)
MCH: 31.6 pg (ref 26.0–34.0)
MCHC: 34.7 g/dL (ref 30.0–36.0)
MCV: 91.2 fL (ref 80.0–100.0)
Monocytes Absolute: 0.3 K/uL (ref 0.1–1.0)
Monocytes Relative: 7 %
Neutro Abs: 2.2 K/uL (ref 1.7–7.7)
Neutrophils Relative %: 46 %
Platelets: 225 K/uL (ref 150–400)
RBC: 4.55 MIL/uL (ref 4.22–5.81)
RDW: 12 % (ref 11.5–15.5)
WBC: 4.9 K/uL (ref 4.0–10.5)
nRBC: 0 % (ref 0.0–0.2)

## 2024-04-01 LAB — COMPREHENSIVE METABOLIC PANEL WITH GFR
ALT: 141 U/L — ABNORMAL HIGH (ref 0–44)
AST: 65 U/L — ABNORMAL HIGH (ref 15–41)
Albumin: 3.6 g/dL (ref 3.5–5.0)
Alkaline Phosphatase: 65 U/L (ref 38–126)
Anion gap: 9 (ref 5–15)
BUN: 9 mg/dL (ref 6–20)
CO2: 27 mmol/L (ref 22–32)
Calcium: 8.8 mg/dL — ABNORMAL LOW (ref 8.9–10.3)
Chloride: 105 mmol/L (ref 98–111)
Creatinine, Ser: 0.96 mg/dL (ref 0.61–1.24)
GFR, Estimated: >60 mL/min (ref >60)
Glucose, Bld: 97 mg/dL (ref 70–99)
Potassium: 3.3 mmol/L — ABNORMAL LOW (ref 3.5–5.1)
Sodium: 141 mmol/L (ref 135–145)
Total Bilirubin: 1.2 mg/dL (ref 0.0–1.2)
Total Protein: 6.7 g/dL (ref 6.5–8.1)

## 2024-04-01 LAB — MAGNESIUM: Magnesium: 2.1 mg/dL (ref 1.7–2.4)

## 2024-04-01 LAB — HEPARIN LEVEL (UNFRACTIONATED): Heparin Unfractionated: 0.64 [IU]/mL (ref 0.30–0.70)

## 2024-04-01 LAB — APTT
aPTT: 76 s — ABNORMAL HIGH (ref 24–36)
aPTT: 76 s — ABNORMAL HIGH (ref 24–36)

## 2024-04-01 LAB — PHOSPHORUS: Phosphorus: 4.1 mg/dL (ref 2.5–4.6)

## 2024-04-01 SURGERY — CHOLECYSTECTOMY, ROBOT-ASSISTED, LAPAROSCOPIC
Anesthesia: General | Site: Abdomen

## 2024-04-01 MED ORDER — ACETAMINOPHEN 10 MG/ML IV SOLN
INTRAVENOUS | Status: DC | PRN
  Administered 2024-04-01: 1000 mg via INTRAVENOUS

## 2024-04-01 MED ORDER — FENTANYL CITRATE (PF) 100 MCG/2ML IJ SOLN
INTRAMUSCULAR | Status: DC | PRN
  Administered 2024-04-01: 100 ug via INTRAVENOUS

## 2024-04-01 MED ORDER — ROCURONIUM BROMIDE 100 MG/10ML IV SOLN
INTRAVENOUS | Status: DC | PRN
  Administered 2024-04-01: 45 mg via INTRAVENOUS
  Administered 2024-04-01: 5 mg via INTRAVENOUS

## 2024-04-01 MED ORDER — DEXMEDETOMIDINE HCL IN NACL 80 MCG/20ML IV SOLN
INTRAVENOUS | Status: DC | PRN
  Administered 2024-04-01: 12 ug via INTRAVENOUS

## 2024-04-01 MED ORDER — DEXAMETHASONE SODIUM PHOSPHATE 10 MG/ML IJ SOLN
INTRAMUSCULAR | Status: DC | PRN
  Administered 2024-04-01: 10 mg via INTRAVENOUS

## 2024-04-01 MED ORDER — PROPOFOL 10 MG/ML IV BOLUS
INTRAVENOUS | Status: DC | PRN
  Administered 2024-04-01: 200 mg via INTRAVENOUS

## 2024-04-01 MED ORDER — BUPIVACAINE-EPINEPHRINE (PF) 0.5% -1:200000 IJ SOLN
INTRAMUSCULAR | Status: DC | PRN
  Administered 2024-04-01: 30 mL via SURGICAL_CAVITY

## 2024-04-01 MED ORDER — ACETAMINOPHEN 10 MG/ML IV SOLN
INTRAVENOUS | Status: AC
Start: 2024-04-01 — End: 2024-04-01
  Filled 2024-04-01: qty 100

## 2024-04-01 MED ORDER — FENTANYL CITRATE (PF) 100 MCG/2ML IJ SOLN
INTRAMUSCULAR | Status: AC
  Filled 2024-04-01: qty 2

## 2024-04-01 MED ORDER — BUPIVACAINE-EPINEPHRINE (PF) 0.25% -1:200000 IJ SOLN
INTRAMUSCULAR | Status: AC
Start: 2024-04-01 — End: 2024-04-01
  Filled 2024-04-01: qty 30

## 2024-04-01 MED ORDER — SUCCINYLCHOLINE CHLORIDE 200 MG/10ML IV SOSY
PREFILLED_SYRINGE | INTRAVENOUS | Status: DC | PRN
  Administered 2024-04-01: 100 mg via INTRAVENOUS

## 2024-04-01 MED ORDER — LACTATED RINGERS IV SOLN: INTRAVENOUS | Status: DC

## 2024-04-01 MED ORDER — FENTANYL CITRATE (PF) 100 MCG/2ML IJ SOLN
25.0000 ug | INTRAMUSCULAR | Status: DC | PRN
  Administered 2024-04-01 (×3): 50 ug via INTRAVENOUS

## 2024-04-01 MED ORDER — SUCCINYLCHOLINE CHLORIDE 200 MG/10ML IV SOSY
PREFILLED_SYRINGE | INTRAVENOUS | Status: AC
  Filled 2024-04-01: qty 10

## 2024-04-01 MED ORDER — KETOROLAC TROMETHAMINE 30 MG/ML IJ SOLN
INTRAMUSCULAR | Status: AC
  Filled 2024-04-01: qty 1

## 2024-04-01 MED ORDER — BUPIVACAINE-EPINEPHRINE (PF) 0.25% -1:200000 IJ SOLN
INTRAMUSCULAR | Status: DC | PRN
  Administered 2024-04-01: 20 mL

## 2024-04-01 MED ORDER — SODIUM CHLORIDE 0.9 % IV SOLN
INTRAVENOUS | Status: AC
Start: 2024-04-01 — End: 2024-04-02

## 2024-04-01 MED ORDER — CEFAZOLIN SODIUM-DEXTROSE 2-4 GM/100ML-% IV SOLN
INTRAVENOUS | Status: AC
  Filled 2024-04-01: qty 100

## 2024-04-01 MED ORDER — LIDOCAINE HCL (CARDIAC) PF 100 MG/5ML IV SOSY
PREFILLED_SYRINGE | INTRAVENOUS | Status: DC | PRN
  Administered 2024-04-01: 100 mg via INTRAVENOUS

## 2024-04-01 MED ORDER — KETAMINE HCL 50 MG/5ML IJ SOSY
PREFILLED_SYRINGE | INTRAMUSCULAR | Status: AC
  Filled 2024-04-01: qty 5

## 2024-04-01 MED ORDER — DEXAMETHASONE SODIUM PHOSPHATE 10 MG/ML IJ SOLN
INTRAMUSCULAR | Status: AC
  Filled 2024-04-01: qty 1

## 2024-04-01 MED ORDER — BUPIVACAINE LIPOSOME 1.3 % IJ SUSP
INTRAMUSCULAR | Status: AC
Start: 2024-04-01 — End: 2024-04-01
  Filled 2024-04-01: qty 20

## 2024-04-01 MED ORDER — KETOROLAC TROMETHAMINE 30 MG/ML IJ SOLN
30.0000 mg | Freq: Four times a day (QID) | INTRAMUSCULAR | Status: DC
  Administered 2024-04-01 – 2024-04-02 (×4): 30 mg via INTRAVENOUS
  Filled 2024-04-01 (×4): qty 1

## 2024-04-01 MED ORDER — 0.9 % SODIUM CHLORIDE (POUR BTL) OPTIME
TOPICAL | Status: DC | PRN
  Administered 2024-04-01: 500 mL

## 2024-04-01 MED ORDER — OXYCODONE HCL 5 MG PO TABS
5.0000 mg | ORAL_TABLET | Freq: Once | ORAL | Status: AC | PRN
  Administered 2024-04-01: 5 mg via ORAL

## 2024-04-01 MED ORDER — CEFAZOLIN SODIUM-DEXTROSE 2-4 GM/100ML-% IV SOLN
2.0000 g | Freq: Three times a day (TID) | INTRAVENOUS | Status: AC
  Administered 2024-04-01 – 2024-04-02 (×3): 2 g via INTRAVENOUS
  Filled 2024-04-01 (×3): qty 100

## 2024-04-01 MED ORDER — ONDANSETRON HCL 4 MG/2ML IJ SOLN
INTRAMUSCULAR | Status: DC | PRN
Start: 2024-04-01 — End: 2024-04-01
  Administered 2024-04-01: 4 mg via INTRAVENOUS

## 2024-04-01 MED ORDER — PHENYLEPHRINE 80 MCG/ML (10ML) SYRINGE FOR IV PUSH (FOR BLOOD PRESSURE SUPPORT)
PREFILLED_SYRINGE | INTRAVENOUS | Status: AC
  Filled 2024-04-01: qty 10

## 2024-04-01 MED ORDER — GLYCOPYRROLATE 0.2 MG/ML IJ SOLN
INTRAMUSCULAR | Status: DC | PRN
  Administered 2024-04-01: .2 mg via INTRAVENOUS

## 2024-04-01 MED ORDER — OXYCODONE HCL 5 MG/5ML PO SOLN: 5.0000 mg | Freq: Once | ORAL | Status: AC | PRN

## 2024-04-01 MED ORDER — ONDANSETRON HCL 4 MG/2ML IJ SOLN: 4.0000 mg | Freq: Once | INTRAMUSCULAR | Status: DC | PRN

## 2024-04-01 MED ORDER — PROPOFOL 10 MG/ML IV BOLUS
INTRAVENOUS | Status: AC
  Filled 2024-04-01: qty 20

## 2024-04-01 MED ORDER — PHENYLEPHRINE HCL (PRESSORS) 10 MG/ML IV SOLN
INTRAVENOUS | Status: DC | PRN
  Administered 2024-04-01: 160 ug via INTRAVENOUS

## 2024-04-01 MED ORDER — MIDAZOLAM HCL 2 MG/2ML IJ SOLN
INTRAMUSCULAR | Status: AC
  Filled 2024-04-01: qty 2

## 2024-04-01 MED ORDER — GLYCOPYRROLATE 0.2 MG/ML IJ SOLN
INTRAMUSCULAR | Status: AC
  Filled 2024-04-01: qty 1

## 2024-04-01 MED ORDER — LIDOCAINE HCL (PF) 2 % IJ SOLN
INTRAMUSCULAR | Status: AC
  Filled 2024-04-01: qty 5

## 2024-04-01 MED ORDER — ACETAMINOPHEN 10 MG/ML IV SOLN: 1000.0000 mg | Freq: Once | INTRAVENOUS | Status: DC | PRN

## 2024-04-01 MED ORDER — KETOROLAC TROMETHAMINE 30 MG/ML IJ SOLN
INTRAMUSCULAR | Status: DC | PRN
  Administered 2024-04-01: 30 mg via INTRAVENOUS

## 2024-04-01 MED ORDER — ONDANSETRON HCL 4 MG/2ML IJ SOLN
INTRAMUSCULAR | Status: AC
  Filled 2024-04-01: qty 2

## 2024-04-01 MED ORDER — MIDAZOLAM HCL 2 MG/2ML IJ SOLN
INTRAMUSCULAR | Status: DC | PRN
  Administered 2024-04-01: 2 mg via INTRAVENOUS

## 2024-04-01 MED ORDER — KETAMINE HCL 50 MG/ML IJ SOLN
INTRAMUSCULAR | Status: DC | PRN
  Administered 2024-04-01: 10 mg via INTRAVENOUS
  Administered 2024-04-01 (×2): 20 mg via INTRAVENOUS

## 2024-04-01 MED ORDER — ROCURONIUM BROMIDE 10 MG/ML (PF) SYRINGE
PREFILLED_SYRINGE | INTRAVENOUS | Status: AC
  Filled 2024-04-01: qty 10

## 2024-04-01 MED ORDER — SUGAMMADEX SODIUM 200 MG/2ML IV SOLN
INTRAVENOUS | Status: DC | PRN
  Administered 2024-04-01: 200 mg via INTRAVENOUS

## 2024-04-01 MED ORDER — APIXABAN 5 MG PO TABS
5.0000 mg | ORAL_TABLET | Freq: Two times a day (BID) | ORAL | Status: DC
  Administered 2024-04-01 – 2024-04-02 (×2): 5 mg via ORAL
  Filled 2024-04-01 (×2): qty 1

## 2024-04-01 MED ORDER — ACETAMINOPHEN 500 MG PO TABS
1000.0000 mg | ORAL_TABLET | Freq: Four times a day (QID) | ORAL | Status: DC
  Administered 2024-04-01 – 2024-04-02 (×3): 1000 mg via ORAL
  Filled 2024-04-01 (×4): qty 2

## 2024-04-01 MED ORDER — OXYCODONE HCL 5 MG PO TABS
ORAL_TABLET | ORAL | Status: AC
  Filled 2024-04-01: qty 1

## 2024-04-01 SURGICAL SUPPLY — 35 items
CANNULA REDUCER 12-8 DVNC XI (CANNULA) IMPLANT
CAUTERY HOOK MNPLR 1.6 DVNC XI (INSTRUMENTS) ×2 IMPLANT
CLIP LIGATING HEMO O LOK GREEN (MISCELLANEOUS) ×2 IMPLANT
DEFOGGER SCOPE WARM SEASHARP (MISCELLANEOUS) ×2 IMPLANT
DERMABOND ADVANCED .7 DNX12 (GAUZE/BANDAGES/DRESSINGS) ×2 IMPLANT
DRAPE ARM DVNC X/XI (DISPOSABLE) ×8 IMPLANT
DRAPE COLUMN DVNC XI (DISPOSABLE) ×2 IMPLANT
ELECTRODE REM PT RTRN 9FT ADLT (ELECTROSURGICAL) ×2 IMPLANT
FORCEPS BPLR R/ABLATION 8 DVNC (INSTRUMENTS) ×2 IMPLANT
FORCEPS PROGRASP DVNC XI (FORCEP) ×2 IMPLANT
GLOVE BIO SURGEON STRL SZ7 (GLOVE) ×4 IMPLANT
GOWN STRL REUS W/ TWL LRG LVL3 (GOWN DISPOSABLE) ×8 IMPLANT
IRRIGATION STRYKERFLOW (MISCELLANEOUS) IMPLANT
IV CATH ANGIO 12GX3 LT BLUE (NEEDLE) IMPLANT
KIT PINK PAD W/HEAD ARM REST (MISCELLANEOUS) ×2 IMPLANT
LABEL OR SOLS (LABEL) ×2 IMPLANT
MANIFOLD NEPTUNE II (INSTRUMENTS) ×2 IMPLANT
NDL HYPO 22X1.5 SAFETY MO (MISCELLANEOUS) ×2 IMPLANT
NEEDLE HYPO 22X1.5 SAFETY MO (MISCELLANEOUS) ×1 IMPLANT
NS IRRIG 500ML POUR BTL (IV SOLUTION) ×2 IMPLANT
OBTURATOR OPTICALSTD 8 DVNC (TROCAR) ×2 IMPLANT
PACK LAP CHOLECYSTECTOMY (MISCELLANEOUS) ×2 IMPLANT
SEAL UNIV 5-12 XI (MISCELLANEOUS) ×8 IMPLANT
SET TUBE SMOKE EVAC HIGH FLOW (TUBING) ×2 IMPLANT
SOLUTION ELECTROSURG ANTI STCK (MISCELLANEOUS) ×2 IMPLANT
SPIKE FLUID TRANSFER (MISCELLANEOUS) ×2 IMPLANT
SPONGE T-LAP 18X18 ~~LOC~~+RFID (SPONGE) ×2 IMPLANT
SPONGE T-LAP 4X18 ~~LOC~~+RFID (SPONGE) IMPLANT
SUT MNCRL AB 4-0 PS2 18 (SUTURE) ×2 IMPLANT
SUT VICRYL 0 UR6 27IN ABS (SUTURE) ×4 IMPLANT
SYR 20ML LL LF (SYRINGE) IMPLANT
SYSTEM BAG RETRIEVAL 10MM (BASKET) ×2 IMPLANT
TRAP FLUID SMOKE EVACUATOR (MISCELLANEOUS) ×2 IMPLANT
WATER STERILE IRR 3000ML UROMA (IV SOLUTION) IMPLANT
WATER STERILE IRR 500ML POUR (IV SOLUTION) ×2 IMPLANT

## 2024-04-01 NOTE — Progress Notes (Signed)
Patient returned from pacu 

## 2024-04-01 NOTE — Anesthesia Procedure Notes (Signed)
 Procedure Name: Intubation Date/Time: 04/01/2024 12:33 PM  Performed by: Leontine Katz, CRNAPre-anesthesia Checklist: Patient identified, Patient being monitored, Timeout performed, Emergency Drugs available and Suction available Patient Re-evaluated:Patient Re-evaluated prior to induction Oxygen Delivery Method: Circle system utilized Preoxygenation: Pre-oxygenation with 100% oxygen Induction Type: IV induction Ventilation: Mask ventilation without difficulty Laryngoscope Size: McGrath and 4 Grade View: Grade I Tube type: Oral Tube size: 7.5 mm Number of attempts: 1 Airway Equipment and Method: Stylet and Video-laryngoscopy Placement Confirmation: ETT inserted through vocal cords under direct vision, positive ETCO2 and breath sounds checked- equal and bilateral Secured at: 22 cm Tube secured with: Tape Dental Injury: Teeth and Oropharynx as per pre-operative assessment

## 2024-04-01 NOTE — Progress Notes (Signed)
 Dr Mazzoni from anesthesia approved for the patient to receive his suboxone and ativan this a.m.

## 2024-04-01 NOTE — Progress Notes (Signed)
 PHARMACY - ANTICOAGULATION CONSULT NOTE  Pharmacy Consult for Heparin Infusion Indication: pulmonary embolus  Allergies  Allergen Reactions   Spiriva Handihaler [Tiotropium Bromide Monohydrate]     Patient Measurements: Height: 6' 2 (188 cm) Weight: 83.9 kg (185 lb) IBW/kg (Calculated) : 82.2 HEPARIN DW (KG): 83.9  Vital Signs: Temp: 97.8 F (36.6 C) (10/08 0454) BP: 109/76 (10/08 0454) Pulse Rate: 60 (10/08 0454)  Labs: Recent Labs    03/29/24 2026 03/30/24 0054 03/30/24 1002 03/30/24 1002 03/30/24 1117 03/31/24 0316 03/31/24 0922 03/31/24 1759 03/31/24 2348 04/01/24 0513  HGB 17.0  --  13.6  --   --  13.3  --   --   --  14.4  HCT 49.4  --  38.1*  --   --  38.2*  --   --   --  41.5  PLT 361  --  229  --   --  216  --   --   --  225  APTT  --   --   --    < > 32 81*   < > 84* 76* 76*  LABPROT  --   --   --   --  17.4*  --   --   --   --   --   INR  --   --   --   --  1.3*  --   --   --   --   --   HEPARINUNFRC  --   --   --   --  >1.10* 0.96*  --   --   --  0.64  CREATININE 0.86  --  0.79  --   --   --   --   --   --   --   TROPONINIHS 3 4  --   --   --   --   --   --   --   --    < > = values in this interval not displayed.    Estimated Creatinine Clearance: 135.6 mL/min (by C-G formula based on SCr of 0.79 mg/dL).   Medical History: Past Medical History:  Diagnosis Date   Anxiety    COPD (chronic obstructive pulmonary disease) (HCC)    DVT (deep venous thrombosis) (HCC)    multiple, in both upper and lower extremities   Pulmonary embolism (HCC)     Medications:  Previously on apixaban  5 mg bid- last dose 10/6 AM  Assessment: Patient is a 43 year old man with a past medical history of COPD and PE on apixaban . He presented to the ED with epigastric pain radiating to his back as well as decreased appetite. Lipase elevated at 2,472. RUQ US  consistent with cholelithiasis without evidence of acute cholecystitis and MRCP revealed acute interstitial  pancreatitis with diffuse pancreatic interstitial edema as well as gallbladder wall edema, no evidence of choledocholithiasis. Pharmacy was consulted to initiate patient on heparin infusion for known PE so patient can be taken to the OR on Wednesday.   Hgb 13.6. PLT 229.  Heparin level >1.1. INR 1.3. aPTT 32.  No signs/symptoms of bleeding in chart.   Goal of Therapy:  Heparin level 0.3-0.7 units/ml aPTT 66-102 seconds Monitor platelets by anticoagulation protocol: Yes   10/7 @ 0316:   aPTT = 81,    HL = 0.96 10/7 0922 aPTT 109,  SUPRAtherapeutic 10/7 1759 aPTT: 84, therapeutic x 1  10/7 2348 aPTT: 76, therapeutic X 2  10/8 0513 aPTT: 76,  HL =  0.64   Plan:  10/8 @ 0513:  aPTT = 76, HL = 0.64, - aPTT therapeutic X 3, now correlating with HL  - will use HL to guide dosing from here on  - continue pt on current rate and recheck HL on 10/9 with AM labs  - monitor CBC daily while on heparin   Matthew Graves D 04/01/2024 6:08 AM

## 2024-04-01 NOTE — Transfer of Care (Signed)
 Immediate Anesthesia Transfer of Care Note  Patient: Matthew Graves  Procedure(s) Performed: CHOLECYSTECTOMY, ROBOT-ASSISTED, LAPAROSCOPIC (Abdomen)  Patient Location: PACU  Anesthesia Type:General  Level of Consciousness: sedated  Airway & Oxygen Therapy: Patient Spontanous Breathing and Patient connected to face mask oxygen  Post-op Assessment: Report given to RN and Post -op Vital signs reviewed and stable  Post vital signs: Reviewed  Last Vitals:  Vitals Value Taken Time  BP 101/69   Temp    Pulse 55 04/01/24 13:43  Resp 12   SpO2 98 % 04/01/24 13:43  Vitals shown include unfiled device data.  Last Pain:  Vitals:   04/01/24 1131  TempSrc:   PainSc: 0-No pain         Complications: No notable events documented.

## 2024-04-01 NOTE — Progress Notes (Signed)
Transported to the OR

## 2024-04-01 NOTE — Anesthesia Preprocedure Evaluation (Addendum)
 Anesthesia Evaluation  Patient identified by MRN, date of birth, ID band Patient awake    Reviewed: Allergy & Precautions, NPO status , Patient's Chart, lab work & pertinent test results  History of Anesthesia Complications Negative for: history of anesthetic complications  Airway Mallampati: IV   Neck ROM: Full    Dental  (+) Missing, Chipped   Pulmonary COPD Former vaping   Pulmonary exam normal breath sounds clear to auscultation       Cardiovascular negative cardio ROS Normal cardiovascular exam Rhythm:Regular Rate:Normal  Hx recurrent DVT/PE on Eliquis    ECG 03/29/24: NSR; rightward axis; ST and T abnormality   Neuro/Psych  PSYCHIATRIC DISORDERS Anxiety Depression    On Suboxone    GI/Hepatic negative GI ROS,,,  Endo/Other  negative endocrine ROS    Renal/GU negative Renal ROS     Musculoskeletal   Abdominal   Peds  Hematology negative hematology ROS (+)   Anesthesia Other Findings   Reproductive/Obstetrics                              Anesthesia Physical Anesthesia Plan  ASA: 3  Anesthesia Plan: General   Post-op Pain Management:    Induction: Intravenous  PONV Risk Score and Plan: 2 and Ondansetron , Dexamethasone and Treatment may vary due to age or medical condition  Airway Management Planned: Oral ETT  Additional Equipment:   Intra-op Plan:   Post-operative Plan: Extubation in OR  Informed Consent: I have reviewed the patients History and Physical, chart, labs and discussed the procedure including the risks, benefits and alternatives for the proposed anesthesia with the patient or authorized representative who has indicated his/her understanding and acceptance.     Dental advisory given  Plan Discussed with: CRNA  Anesthesia Plan Comments: (Patient consented for risks of anesthesia including but not limited to:  - adverse reactions to medications -  damage to eyes, teeth, lips or other oral mucosa - nerve damage due to positioning  - sore throat or hoarseness - damage to heart, brain, nerves, lungs, other parts of body or loss of life  Informed patient about role of CRNA in peri- and intra-operative care.  Patient voiced understanding.)         Anesthesia Quick Evaluation

## 2024-04-01 NOTE — Progress Notes (Addendum)
 PROGRESS NOTE    Matthew Graves  FMW:[id number] DOB: [birth year] DOA: 03/29/2024 PCP: Pcp, No  Chief Complaint  Patient presents with   Chest Pain    Hospital Course:  Matthew Graves is a 43 y.o. 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  Patient presents to the ED with worsening epigastric pain radiating to the right shoulder and back with decreased appetite and progressive pain after drinking alcohol.  Subjective: No acute issues or events overnight, awaiting surgery later this morning but otherwise pain appears to be controlled denies nausea vomiting diarrhea constipation headache fevers chills shortness of breath  Objective: Vitals:   03/31/24 1605 03/31/24 2101 04/01/24 0454 04/01/24 0814  BP: 111/84 120/74 109/76 112/83  Pulse: 63 66 60 (!) 55  Resp: 16 18 20 16   Temp: 97.7 F (36.5 C) 97.6 F (36.4 C) 97.8 F (36.6 C) 98.7 F (37.1 C)  TempSrc: Oral   Oral  SpO2: 98% 96% 97% 98%  Weight:      Height:        Intake/Output Summary (Last 24 hours) at 04/01/2024 1058 Last data filed at 04/01/2024 0500 Gross per 24 hour  Intake 109.62 ml  Output --  Net 109.62 ml   Filed Weights   03/29/24 2025  Weight: 83.9 kg    Examination: General:  Pleasantly resting in bed, No acute distress. HEENT:  Normocephalic atraumatic.  Sclerae nonicteric, noninjected.  Extraocular movements intact bilaterally. Neck:  Without mass or deformity.  Trachea is midline. Lungs:  Clear to auscultate bilaterally without rhonchi, wheeze, or rales. Heart:  Regular rate and rhythm.  Without murmurs, rubs, or gallops. Abdomen:  Soft, minimally tender at the epigastrium. Extremities: Without cyanosis, clubbing, edema, or obvious deformity. Skin:  Warm and dry, no erythema.  Assessment & Plan:  Principal Problem:   Acute gallstone pancreatitis Active Problems:   Pancreatitis  Acute gallstone pancreatitis Intractable abdomimal pain - Likely passed  stone given improving pain, lipase currently within normal limits - MRCP negative for choledocholithiasis, general surgery following for planned laparoscopic cholecystectomy today on 04/01/2024 - N.p.o. 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 - Most recent PE on 9/19 when he was off anticoagulation. - Resume Eliquis  04/01/24 - ok per surgery   Suboxone therapy - Reports Suboxone prescribed to help wean off Xanax - Hold additional narcotics at this time   COPD, not currently in exacerbation - Resume home dose inhalers   Anxiety Depression - Controlled on alprazolam   Alcohol use - Occasional, 1 drink rarely, last drink weeks ago -No indication for CIWA protocol, appears to be well controlled at this time  DVT prophylaxis: Heparin   Code Status: Full Code Disposition: Inpatient pending OR in a.m.  Consultants:  Treatment Team:  Consulting Physician: Jordis Laneta FALCON, MD  Procedures:  Laparoscopic cholecystectomy plan 2025  Antimicrobials:  Anti-infectives (From admission, onward)    Start     Dose/Rate Route Frequency Ordered Stop   04/01/24 0600  ceFAZolin (ANCEF) IVPB 2g/100 mL premix        2 g 200 mL/hr over 30 Minutes Intravenous On call to O.R. 03/31/24 0858 04/02/24 0559       Data Reviewed: I have personally reviewed following labs and imaging studies CBC: Recent Labs  Lab 03/29/24 2026 03/30/24 1002 03/31/24 0316 04/01/24 0513  WBC 13.7* 6.5 4.9 4.9  NEUTROABS  --  5.0  --  2.2  HGB 17.0 13.6 13.3 14.4  HCT 49.4 38.1* 38.2* 41.5  MCV 91.1 89.0 91.2 91.2  PLT 361 229 216 225   Basic Metabolic Panel: Recent Labs  Lab 03/29/24 2026 03/30/24 1002 04/01/24 0513  NA 138 139 141  K 4.5 4.0 3.3*  CL 103 103 105  CO2 23 23 27   GLUCOSE 137* 92 97  BUN 11 9 9   CREATININE 0.86 0.79 0.96  CALCIUM 9.2 8.7* 8.8*  MG  --   --  2.1  PHOS  --   --  4.1    GFR: Estimated Creatinine Clearance: 113 mL/min (by C-G formula based on SCr of 0.96 mg/dL). Liver Function Tests: Recent Labs  Lab 03/30/24 0054 03/30/24 1002 04/01/24 0513  AST 292* 228* 65*  ALT 231* 253* 141*  ALKPHOS 52 57 65  BILITOT 2.6* 4.5* 1.2  PROT 5.5* 5.5* 6.7  ALBUMIN 3.1* 3.1* 3.6   CBG: No results for input(s): GLUCAP in the last 168 hours.  No results found for this or any previous visit (from the past 240 hours).   Radiology Studies: No results found.   Scheduled Meds:  ALPRAZolam  1 mg Oral BID   brexpiprazole  1 mg Oral QHS   buprenorphine-naloxone  1 tablet Sublingual BID   indocyanine green  1.25 mg Intravenous On Call to OR   ketorolac   15 mg Intravenous Q8H   melatonin  2.5 mg Oral QHS   PARoxetine  10 mg Oral Daily   Continuous Infusions:  sodium chloride 40 mL/hr at 04/01/24 0824    ceFAZolin (ANCEF) IV       LOS: 1 day   Time spent: 55 min  Elsie JAYSON [place], DO [facility]  To contact the attending physician between 7A-7P please use Epic Chat. To contact the covering physician during after hours 7P-7A, please review Amion.  04/01/2024, 10:58 AM

## 2024-04-01 NOTE — Progress Notes (Addendum)
 [facility] PRO[facility]ESS [facility]  Hospital Day(s): 1.   Interval History:  Patient seen and examined No acute events or new complaints overnight.  Patient reports he is doing well No abdominal pain No fever, chills, nausea, emesis  He remains without leukocytosis; WBC 4.9K Hgb to 14.4 Renal function normal; sCr - 0.96; UO - unmeasured Hypokalemia to 3.3 Bilirubin normalized; 1.2 NPO this morning   Vital signs in last 24 hours: [min-max] current  Temp:  [97.6 F (36.4 C)-98 F (36.7 C)] 97.8 F (36.6 C) (10/08 0454) Pulse Rate:  [60-66] 60 (10/08 0454) Resp:  [16-20] 20 (10/08 0454) BP: (105-120)/(73-84) 109/76 (10/08 0454) SpO2:  [96 %-98 %] 97 % (10/08 0454)     Height: 6' 2 (188 cm) Weight: 83.9 kg BMI (Calculated): 23.74   Intake/Output last 2 shifts:  10/07 0701 - 10/08 0700 In: 109.6 [I.V.:109.6] Out: -    Physical Exam:  Constitutional: alert, cooperative and no distress  Respiratory: breathing non-labored at rest  Cardiovascular: regular rate and sinus rhythm  Gastrointestinal: soft, non-tender, and non-distended Integumentary: warm, dry   Labs:     Latest Ref Rng & Units 04/01/2024    5:13 AM 03/31/2024    3:16 AM 03/30/2024   10:02 AM  CBC  WBC 4.0 - 10.5 K/uL 4.9  4.9  6.5   Hemoglobin 13.0 - 17.0 g/dL 85.5  86.6  86.3   Hematocrit 39.0 - 52.0 % 41.5  38.2  38.1   Platelets 150 - 400 K/uL 225  216  229       Latest Ref Rng & Units 04/01/2024    5:13 AM 03/30/2024   10:02 AM 03/30/2024   12:54 AM  CMP  Glucose 70 - 99 mg/dL 97  92    BUN 6 - 20 mg/dL 9  9    Creatinine 9.38 - 1.24 mg/dL 9.03  9.20    Sodium 864 - 145 mmol/L 141  139    Potassium 3.5 - 5.1 mmol/L 3.3  4.0    Chloride 98 - 111 mmol/L 105  103    CO2 22 - 32 mmol/L 27  23    Calcium 8.9 - 10.3 mg/dL 8.8  8.7    Total Protein 6.5 - 8.1 g/dL 6.7  5.5  5.5   Total Bilirubin 0.0 - 1.2 mg/dL 1.2  4.5  2.6   Alkaline Phos 38 - 126 U/L 65  57  52   AST 15 - 41 U/L 65   228  292   ALT 0 - 44 U/L 141  253  231      Imaging studies: No new pertinent imaging studies   Assessment/Plan:  43 y.o. male with gallstone pancreatitis    - Plan for robotic assisted laparoscopic cholecystectomy today with Dr Jordis pending OR/Anesthesia availability  - All risks, benefits, and alternatives to above procedure(s) were discussed with the patient, all of his questions were answered to his expressed satisfaction, patient expresses he wishes to proceed, and informed consent was obtained.   - NPO for planned procedure; Will start IVF support this AM - IV Abx on call to OR - ICG on call to the OR  - Monitor abdominal examination; on-going bowel function    - Pain control prn; antiemetics prn - Monitor hyperbilirubinemia; resolved  - Okay to mobilize  - Stop heparin gtt this AM anticipating surgery   - Further management per primary service; we will follow   All of the above  findings and recommendations were discussed with the patient, and the medical team, and all of patient's questions were answered to his expressed satisfaction.  -- Arthea Platt, PA-C [facility] 04/01/2024, 7:35 AM M-F: 7am - 4pm

## 2024-04-01 NOTE — Op Note (Signed)
 Robotic assisted laparoscopic Cholecystectomy  Pre-operative Diagnosis: gallstone pancreatitis  Post-operative Diagnosis: same   Procedure:  Robotic assisted laparoscopic Cholecystectomy  Surgeon: Laneta Luna, MD FACS  Anesthesia: Gen. with endotracheal tube  Findings: Chronic Cholecystitis   Estimated Blood Loss: 5 cc       Specimens: Gallbladder           Complications: none   Procedure Details  The patient was seen again in the Holding Room. The benefits, complications, treatment options, and expected outcomes were discussed with the patient. The risks of bleeding, infection, recurrence of symptoms, failure to resolve symptoms, bile duct damage, bile duct leak, retained common bile duct stone, bowel injury, any of which could require further surgery and/or ERCP, stent, or papillotomy were reviewed with the patient. The likelihood of improving the patient's symptoms with return to their baseline status is good.  The patient and/or family concurred with the proposed plan, giving informed consent.  The patient was taken to Operating Room, identified  and the procedure verified as Laparoscopic Cholecystectomy.  A Time Out was held and the above information confirmed.  Prior to the induction of general anesthesia, antibiotic prophylaxis was administered. VTE prophylaxis was in place. General endotracheal anesthesia was then administered and tolerated well. After the induction, the abdomen was prepped with Chloraprep and draped in the sterile fashion. The patient was positioned in the supine position.  Cut down technique was used to enter the abdominal cavity and a Hasson trochar was placed after two vicryl stitches were anchored to the fascia. Pneumoperitoneum was then created with CO2 and tolerated well without any adverse changes in the patient's vital signs.  Three 8-mm ports were placed under direct vision. All skin incisions  were infiltrated with a local anesthetic agent before  making the incision and placing the trocars.   The patient was positioned  in reverse Trendelenburg, robot was brought to the surgical field and docked in the standard fashion.  We made sure all the instrumentation was kept indirect view at all times and that there were no collision between the arms. I scrubbed out and went to the console.  The gallbladder was identified, the fundus grasped and retracted cephalad. Adhesions were lysed bluntly. The infundibulum was grasped and retracted laterally, exposing the peritoneum overlying the triangle of Calot. This was then divided and exposed in a blunt fashion. An extended critical view of the cystic duct and cystic artery was obtained.  The cystic duct was clearly identified and bluntly dissected.   Artery and duct were double clipped and divided. Using ICG cholangiography we visualize the cystic duct and CBD w/o evidence of bile injuries. The gallbladder was taken from the gallbladder fossa in a retrograde fashion with the electrocautery.  Hemostasis was achieved with the electrocautery. nspection of the right upper quadrant was performed. No bleeding, bile duct injury or leak, or bowel injury was noted. Robotic instruments and robotic arms were undocked in the standard fashion.  I scrubbed back in.  The gallbladder was removed and placed in an Endocatch bag.   Pneumoperitoneum was released.  The periumbilical port site was closed with interrumpted 0 Vicryl sutures. 4-0 subcuticular Monocryl was used to close the skin. Dermabond was  applied.  The patient was then extubated and brought to the recovery room in stable condition. Sponge, lap, and needle counts were correct at closure and at the conclusion of the case.               Laneta Luna, MD,  FACS

## 2024-04-01 NOTE — Plan of Care (Signed)
   Problem: Education: Goal: Knowledge of General Education information will improve Description: Including pain rating scale, medication(s)/side effects and non-pharmacologic comfort measures Outcome: Progressing   Problem: Clinical Measurements: Goal: Ability to maintain clinical measurements within normal limits will improve Outcome: Progressing Goal: Will remain free from infection Outcome: Progressing

## 2024-04-01 NOTE — Progress Notes (Signed)
 PHARMACY - ANTICOAGULATION CONSULT NOTE  Pharmacy Consult for Heparin Infusion Indication: pulmonary embolus  Allergies  Allergen Reactions   Spiriva Handihaler [Tiotropium Bromide Monohydrate]     Patient Measurements: Height: 6' 2 (188 cm) Weight: 83.9 kg (185 lb) IBW/kg (Calculated) : 82.2 HEPARIN DW (KG): 83.9  Vital Signs: Temp: 97.6 F (36.4 C) (10/07 2101) Temp Source: Oral (10/07 1605) BP: 120/74 (10/07 2101) Pulse Rate: 66 (10/07 2101)  Labs: Recent Labs    03/29/24 2026 03/30/24 0054 03/30/24 1002 03/30/24 1117 03/30/24 1117 03/31/24 0316 03/31/24 0922 03/31/24 1759 03/31/24 2348  HGB 17.0  --  13.6  --   --  13.3  --   --   --   HCT 49.4  --  38.1*  --   --  38.2*  --   --   --   PLT 361  --  229  --   --  216  --   --   --   APTT  --   --   --  32   < > 81* 109* 84* 76*  LABPROT  --   --   --  17.4*  --   --   --   --   --   INR  --   --   --  1.3*  --   --   --   --   --   HEPARINUNFRC  --   --   --  >1.10*  --  0.96*  --   --   --   CREATININE 0.86  --  0.79  --   --   --   --   --   --   TROPONINIHS 3 4  --   --   --   --   --   --   --    < > = values in this interval not displayed.    Estimated Creatinine Clearance: 135.6 mL/min (by C-G formula based on SCr of 0.79 mg/dL).   Medical History: Past Medical History:  Diagnosis Date   Anxiety    COPD (chronic obstructive pulmonary disease) (HCC)    DVT (deep venous thrombosis) (HCC)    multiple, in both upper and lower extremities   Pulmonary embolism (HCC)     Medications:  Previously on apixaban  5 mg bid- last dose 10/6 AM  Assessment: Patient is a 43 year old man with a past medical history of COPD and PE on apixaban . He presented to the ED with epigastric pain radiating to his back as well as decreased appetite. Lipase elevated at 2,472. RUQ US  consistent with cholelithiasis without evidence of acute cholecystitis and MRCP revealed acute interstitial pancreatitis with diffuse  pancreatic interstitial edema as well as gallbladder wall edema, no evidence of choledocholithiasis. Pharmacy was consulted to initiate patient on heparin infusion for known PE so patient can be taken to the OR on Wednesday.   Hgb 13.6. PLT 229.  Heparin level >1.1. INR 1.3. aPTT 32.  No signs/symptoms of bleeding in chart.   Goal of Therapy:  Heparin level 0.3-0.7 units/ml aPTT 66-102 seconds Monitor platelets by anticoagulation protocol: Yes   10/7 @ 0316:   aPTT = 81,    HL = 0.96 10/7 0922 aPTT 109,  SUPRAtherapeutic 10/7 1759 aPTT: 84, therapeutic x 1  10/7 2348 aPTT: 76, therapeutic X 2    Plan:  10/7 @ 2348:  aPTT = 76, therapeutic X 2  - will continue pt on current rate and  recheck aPTT and HL on 10/8 with AM labs - will use aPTT for monitoring until HL correlates - monitor CBC daily while on heparin   Tynika Luddy D 04/01/2024 12:40 AM

## 2024-04-02 DIAGNOSIS — K851 Biliary acute pancreatitis without necrosis or infection: Secondary | ICD-10-CM | POA: Diagnosis not present

## 2024-04-02 LAB — COMPREHENSIVE METABOLIC PANEL WITH GFR
ALT: 100 U/L — ABNORMAL HIGH (ref 0–44)
AST: 40 U/L (ref 15–41)
Albumin: 3.7 g/dL (ref 3.5–5.0)
Alkaline Phosphatase: 50 U/L (ref 38–126)
Anion gap: 4 — ABNORMAL LOW (ref 5–15)
BUN: 8 mg/dL (ref 6–20)
CO2: 27 mmol/L (ref 22–32)
Calcium: 8.7 mg/dL — ABNORMAL LOW (ref 8.9–10.3)
Chloride: 107 mmol/L (ref 98–111)
Creatinine, Ser: 0.91 mg/dL (ref 0.61–1.24)
GFR, Estimated: >60 mL/min (ref >60)
Glucose, Bld: 130 mg/dL — ABNORMAL HIGH (ref 70–99)
Potassium: 4.1 mmol/L (ref 3.5–5.1)
Sodium: 138 mmol/L (ref 135–145)
Total Bilirubin: 0.9 mg/dL (ref 0.0–1.2)
Total Protein: 6.4 g/dL — ABNORMAL LOW (ref 6.5–8.1)

## 2024-04-02 LAB — CBC WITH DIFFERENTIAL/PLATELET
Abs Immature Granulocytes: 0.07 K/uL (ref 0.00–0.07)
Basophils Absolute: 0 K/uL (ref 0.0–0.1)
Basophils Relative: 0 %
Eosinophils Absolute: 0 K/uL (ref 0.0–0.5)
Eosinophils Relative: 0 %
HCT: 39.8 % (ref 39.0–52.0)
Hemoglobin: 13.9 g/dL (ref 13.0–17.0)
Immature Granulocytes: 1 %
Lymphocytes Relative: 12 %
Lymphs Abs: 1.1 K/uL (ref 0.7–4.0)
MCH: 31.4 pg (ref 26.0–34.0)
MCHC: 34.9 g/dL (ref 30.0–36.0)
MCV: 90 fL (ref 80.0–100.0)
Monocytes Absolute: 0.6 K/uL (ref 0.1–1.0)
Monocytes Relative: 7 %
Neutro Abs: 7.1 K/uL (ref 1.7–7.7)
Neutrophils Relative %: 80 %
Platelets: 281 K/uL (ref 150–400)
RBC: 4.42 MIL/uL (ref 4.22–5.81)
RDW: 11.9 % (ref 11.5–15.5)
WBC: 8.8 K/uL (ref 4.0–10.5)
nRBC: 0 % (ref 0.0–0.2)

## 2024-04-02 LAB — PHOSPHORUS: Phosphorus: 3.5 mg/dL (ref 2.5–4.6)

## 2024-04-02 LAB — MAGNESIUM: Magnesium: 2.1 mg/dL (ref 1.7–2.4)

## 2024-04-02 MED ORDER — ALBUTEROL SULFATE (2.5 MG/3ML) 0.083% IN NEBU: 2.5000 mg | INHALATION_SOLUTION | Freq: Four times a day (QID) | RESPIRATORY_TRACT | Status: DC | PRN

## 2024-04-02 MED ORDER — ONDANSETRON 4 MG PO TBDP: 4.0000 mg | ORAL_TABLET | Freq: Three times a day (TID) | ORAL | 0 refills | Status: AC | PRN

## 2024-04-02 MED ORDER — OXYCODONE HCL 5 MG PO TABS: 5.0000 mg | ORAL_TABLET | Freq: Three times a day (TID) | ORAL | 0 refills | Status: DC | PRN

## 2024-04-02 MED ORDER — APIXABAN 5 MG PO TABS: 5.0000 mg | ORAL_TABLET | Freq: Two times a day (BID) | ORAL | 0 refills | Status: AC

## 2024-04-02 MED ORDER — OXYCODONE HCL 5 MG PO TABS: 5.0000 mg | ORAL_TABLET | Freq: Three times a day (TID) | ORAL | Status: DC | PRN

## 2024-04-02 MED ORDER — ONDANSETRON HCL 4 MG PO TABS: 4.0000 mg | ORAL_TABLET | Freq: Four times a day (QID) | ORAL | Status: DC | PRN

## 2024-04-02 MED ORDER — ONDANSETRON HCL 4 MG PO TABS: 4.0000 mg | ORAL_TABLET | Freq: Four times a day (QID) | ORAL | 0 refills | Status: AC | PRN

## 2024-04-02 NOTE — Progress Notes (Signed)
 [facility] PROGRESS NOTE  Hospital Day(s): 2.   Post op day(s): 1 Day Post-Op.   Interval History:  Patient seen and examined No acute events or new complaints overnight.  Patient reports he is doing well Incisional soreness; mild No fever, chills, nausea, emesis  Labs are reassuring this AM Bilirubin remains normal; 0.9 Diet restarted; no issue   Vital signs in last 24 hours: [min-max] current  Temp:  [97.4 F (36.3 C)-99 F (37.2 C)] 98.5 F (36.9 C) (10/09 0447) Pulse Rate:  [55-86] 67 (10/09 0447) Resp:  [11-16] 16 (10/09 0447) BP: (101-126)/(69-90) 114/77 (10/09 0447) SpO2:  [96 %-100 %] 96 % (10/09 0447) Weight:  [83.9 kg] 83.9 kg (10/08 1131)     Height: 6' 2 (188 cm) Weight: 83.9 kg BMI (Calculated): 23.74   Intake/Output last 2 shifts:  10/08 0701 - 10/09 0700 In: 1205 [P.O.:405; I.V.:600; IV Piggyback:200] Out: -    Physical Exam:  Constitutional: alert, cooperative and no distress  Respiratory: breathing non-labored at rest  Cardiovascular: regular rate and sinus rhythm  Gastrointestinal: soft, incisional soreness, and non-distended Integumentary: laparoscopic incisions are CDI with dermabond, no erythema or drainage   Labs:     Latest Ref Rng & Units 04/02/2024    5:10 AM 04/01/2024    5:13 AM 03/31/2024    3:16 AM  CBC  WBC 4.0 - 10.5 K/uL 8.8  4.9  4.9   Hemoglobin 13.0 - 17.0 g/dL 86.0  85.5  86.6   Hematocrit 39.0 - 52.0 % 39.8  41.5  38.2   Platelets 150 - 400 K/uL 281  225  216       Latest Ref Rng & Units 04/02/2024    5:10 AM 04/01/2024    5:13 AM 03/30/2024   10:02 AM  CMP  Glucose 70 - 99 mg/dL 869  97  92   BUN 6 - 20 mg/dL 8  9  9    Creatinine 0.61 - 1.24 mg/dL 9.08  9.03  9.20   Sodium 135 - 145 mmol/L 138  141  139   Potassium 3.5 - 5.1 mmol/L 4.1  3.3  4.0   Chloride 98 - 111 mmol/L 107  105  103   CO2 22 - 32 mmol/L 27  27  23    Calcium 8.9 - 10.3 mg/dL 8.7  8.8  8.7   Total Protein 6.5 - 8.1 g/dL 6.4   6.7  5.5   Total Bilirubin 0.0 - 1.2 mg/dL 0.9  1.2  4.5   Alkaline Phos 38 - 126 U/L 50  65  57   AST 15 - 41 U/L 40  65  228   ALT 0 - 44 U/L 100  141  253      Imaging studies: No new pertinent imaging studies   Assessment/Plan:  43 y.o. male 1 Day Post-Op s/p laparoscopic cholecystectomy for gallstone pancreatitis, complicated by pertinent comorbidities including history of PE/DVT on Eliquis . .   - Okay to continue diet as tolerated; Reviewed dietary recommendations and update instructions  - No need for Abx for home   - Monitor abdominal examination; on-going bowel function - Pain control prn; antiemetics prn   - Okay to mobilize; reviewed restrictions   - Okay to resume/continue Eliquis   - Further management per primary service  - Discharge Planning: Okay for discharge from surgical perspective. We will follow up in ~2 weeks. Instructions updated.    All of the above findings and recommendations were discussed  with the patient, and the medical team, and all of patient's questions were answered to his expressed satisfaction.  -- Arthea Platt, PA-C [facility] 04/02/2024, 7:22 AM M-F: 7am - 4pm

## 2024-04-02 NOTE — Hospital Course (Signed)
 Mr. Velton Roselle is a 43 year old male with history of COPD, multiple DVTs and PEs currently on Eliquis , anxiety, depression, chronic pain, who presents ED for chief concerns of epigastric pain radiating to his back.  Reports the pain was ongoing for 3 days prior to ED presentation.  Labs in the ED revealed lipase of 2472.  AST and ALT were elevated.  T. bili was 2.6.  WBC 13.7.  Right upper quadrant ultrasound was consistent with cholelithiasis without evidence of acute cholecystitis.  General surgery was consulted.  MRCP was performed with revealed acute interstitial pancreatitis with diffuse pancreatic interstitial edema as well as gallbladder wall edema and no evidence of choledocholithiasis.  CBD was not dilated.  Patient was admitted for gallstone pancreatitis.  Patient is status post cholecystectomy on 04/01/2024.

## 2024-04-02 NOTE — TOC CM/SW Note (Signed)
 Transition of Care [facility]) - Inpatient Brief Assessment   Patient Details  Name: Matthew Graves MRN: [id number] Date of Birth: 05-Jun-1981  Transition of Care [facility]) CM/SW Contact:    Corean ONEIDA Haddock, RN Phone Number: 04/02/2024, 1:54 PM   Clinical Narrative:    Transition of Care [facility]) Screening Note   Patient Details  Name: Matthew Graves Date of Birth: 03-16-1981   Transition of Care [facility]) CM/SW Contact:    Corean ONEIDA Haddock, RN Phone Number: 04/02/2024, 1:54 PM    Transition of Care Department [facility]) has reviewed patient and no TOC needs have been identified at this time.  If new patient transition needs arise, please place a TOC consult.   Transition of Care Asessment: Insurance and Status: Insurance coverage has been reviewed Patient has primary care physician: No (List of local PCP AVS)     Prior/Current Home Services: No current home services Social Drivers of Health Review: SDOH reviewed no interventions necessary Readmission risk has been reviewed: Yes Transition of care needs: no transition of care needs at this time

## 2024-04-02 NOTE — Discharge Summary (Signed)
 Physician Discharge Summary   Patient: ANCIL DEWAN MRN: [id number] DOB: 1981-04-27  Admit date:     03/29/2024  Discharge date: 04/02/24  Discharge Physician: Greig SAILOR Taelyn Nemes   PCP: Pcp, No   Recommendations at discharge:   Do not lift more than 5 pounds Pain medication as needed to reduce the pain down to 50% not to make the pain 0 Follow-up with general surgeon as appropriate Resume home anticoagulation, Eliquis   Discharge Diagnoses: Principal Problem:   Acute gallstone pancreatitis Active Problems:   History of DVT (deep vein thrombosis)   History of pulmonary embolism   Pancreatitis  Resolved Problems:   * No resolved hospital problems. [facility] Course: Mr. 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  Reports the pain was ongoing for 3 days prior to ED presentation.  Labs in the ED revealed lipase of 2472.  AST and ALT were elevated.  T. bili was 2.6.  WBC 13.7.  Right upper quadrant ultrasound was consistent with cholelithiasis without evidence of acute cholecystitis.  General surgery was consulted.  MRCP was performed with revealed acute interstitial pancreatitis with diffuse pancreatic interstitial edema as well as gallbladder wall edema and no evidence of choledocholithiasis.  CBD was not dilated.  Patient was admitted for gallstone pancreatitis.  Patient is status post cholecystectomy on 04/01/2024.  Current vitals on day of discharge was reviewed which showed afebrile, temperature of 97.6, respiration rate 16, heart rate 66, blood pressure 121/87, currently 95/58.  Patient does not appear to be in acute distress.  He was able to tell me his first and last name, age, location, current calendar year.  He reports his pain is somewhat controlled at this time.  Surgical site has some tenderness especially  with palpation.  There is mild redness.  He understands he needs to follow-up with outpatient surgery.  Assessment and Plan:  * Acute gallstone pancreatitis Status post cholecystectomy on 04/01/2024 Patient has not had a bowel movement however endorses a lot of flatulence Per general surgery, no indications for antibiotic at home. To monitor for ongoing bowel function.  As needed medications include antiemetics as needed.  From surgical standpoint can resume Eliquis . The following prescription: As needed ondansetron  4 mg p.o. every 6 hours as needed for nausea and vomiting, oxycodone  5 mg every 8 hours as needed for severe pain, breakthrough pain, 12 tablets.   History of pulmonary embolism Eliquis  5 mg p.o. twice daily were resumed during hospitalization and after surgery Patient requesting apixaban  prescription as he only has enough for 2 more days Apixaban  5 mg p.o. twice daily, prescription written for 1 month supply, 60 tablets Patient states that he understands he needs to follow-up with outpatient hematologist for continuation of prescription as appropriate  Pain control - [place]  Controlled Substance Reporting System database was reviewed. and patient was instructed, not to drive, operate heavy machinery, perform activities at heights, swimming or participation in water activities or provide baby-sitting services while on Pain, Sleep and Anxiety Medications; until their outpatient Physician has advised to do so again. Also recommended to not to take more than prescribed Pain, Sleep and Anxiety Medications.  Consultants: General surgery Procedures performed: Cystectomy on 04/01/2024 Disposition: Home Diet recommendation: Regular diet, heart healthy diet  Discharge Diet Orders (From admission, onward)     Start  Ordered   04/02/24 0000  Diet - low sodium heart healthy        04/02/24 1323           Regular diet DISCHARGE MEDICATION: Allergies as of 04/02/2024        Reactions   Spiriva Handihaler [tiotropium Bromide]         Medication List     PAUSE taking these medications    Rexulti 1 MG Tabs tablet Wait to take this until your doctor or other care provider tells you to start again. Generic drug: brexpiprazole Take 1 mg by mouth at bedtime.       STOP taking these medications    Apixaban  Starter Pack (10mg  and 5mg ) Commonly known as: ELIQUIS  STARTER PACK Replaced by: apixaban  5 MG Tabs tablet       TAKE these medications    ALPRAZolam 1 MG tablet Commonly known as: XANAX Take 1 mg by mouth 2 (two) times daily.   apixaban  5 MG Tabs tablet Commonly known as: ELIQUIS  Take 1 tablet (5 mg total) by mouth 2 (two) times daily. Replaces: Apixaban  Starter Pack (10mg  and 5mg )   buprenorphine-naloxone 2-0.5 mg Subl SL tablet Commonly known as: SUBOXONE Place 1 tablet under the tongue 2 (two) times daily.   Melatonin 12 MG Tabs Take 1 tablet by mouth at bedtime.   ondansetron  4 MG tablet Commonly known as: ZOFRAN  Take 1 tablet (4 mg total) by mouth every 6 (six) hours as needed for nausea or vomiting.   oxyCODONE  5 MG immediate release tablet Commonly known as: Oxy IR/ROXICODONE  Take 1 tablet (5 mg total) by mouth every 8 (eight) hours as needed for severe pain (pain score 7-10) or breakthrough pain.   PARoxetine 10 MG tablet Commonly known as: PAXIL Take 10 mg by mouth daily.               Discharge Care Instructions  (From admission, onward)           Start     Ordered   04/02/24 0000  Discharge wound care:       Comments: Per General surgery recommendations. Follow-up with general surgeon outpatient.   04/02/24 1323            Follow-up Information     Schulz, Zachary R, PA-C. Go on 04/15/2024.   Specialty: Physician Assistant Why: Go to appointment on 10/22 at 300 PM Contact information: [address] [place] [address] [phone]                Discharge  Exam: Fredricka Weights   03/29/24 2025 04/01/24 1131  Weight: 83.9 kg 83.9 kg   Condition at discharge: good  The results of significant diagnostics from this hospitalization (including imaging, microbiology, ancillary and laboratory) are listed below for reference.   Imaging Studies: MR ABDOMEN MRCP WO CONTRAST Result Date: 03/30/2024 EXAM: MRCP WITHOUT IV CONTRAST 03/30/2024 02:58:48 AM TECHNIQUE: Multisequence, multiplanar magnetic resonance images of the abdomen without intravenous contrast. MRCP sequences were performed. COMPARISON: Right upper quadrant abdominal ultrasound 03/29/2024. CLINICAL HISTORY: RUQ abdominal pain, biliary disease suspected, US  nondiagnostic. FINDINGS: LIVER: Unremarkable. GALLBLADDER AND BILIARY SYSTEM: Multiple small gallstones are identified measuring up to 3 mm. Gallbladder wall edema is identified measuring up to 6 mm. Common bile duct is nondilated measuring up to 5 mm. No signs of choledocholithiasis. No intrahepatic ductal dilation. SPLEEN: Unremarkable. PANCREAS/PANCREATIC DUCT: Diffuse pancreatic interstitial edema identified without main duct dilatation or mass. No focal fluid collections identified  to suggest pseudocyst. No unenhanced findings of pancreatic necrosis. ADRENAL GLANDS: Unremarkable. KIDNEYS: Unremarkable. LYMPH NODES: No enlarged abdominal lymph nodes. VASCULATURE: Unremarkable. PERITONEUM: No ascites. ABDOMINAL WALL: No hernia. No mass. BOWEL: Grossly unremarkable. No bowel obstruction. BONES: No acute abnormality or worrisome osseous lesion. SOFT TISSUES: Unremarkable. MISCELLANEOUS: Unremarkable. IMPRESSION: 1. Acute interstitial pancreatitis: diffuse pancreatic interstitial edema without main duct dilatation or mass; no pseudocyst or pancreatic necrosis. 2. Gallstones and gallbladder wall edema. In the setting of acute pancreatitis, gallbladder wall edema is a nonspecific finding. 3. No evidence of choledocholithiasis; common bile duct nondilated.  Electronically signed by: Waddell Calk MD 03/30/2024 05:10 AM EDT RP Workstation: [id number]   MR 3D Recon At Scanner Result Date: 03/30/2024 EXAM: MRCP WITHOUT IV CONTRAST 03/30/2024 02:58:48 AM TECHNIQUE: Multisequence, multiplanar magnetic resonance images of the abdomen without intravenous contrast. MRCP sequences were performed. COMPARISON: Right upper quadrant abdominal ultrasound 03/29/2024. CLINICAL HISTORY: RUQ abdominal pain, biliary disease suspected, US  nondiagnostic. FINDINGS: LIVER: Unremarkable. GALLBLADDER AND BILIARY SYSTEM: Multiple small gallstones are identified measuring up to 3 mm. Gallbladder wall edema is identified measuring up to 6 mm. Common bile duct is nondilated measuring up to 5 mm. No signs of choledocholithiasis. No intrahepatic ductal dilation. SPLEEN: Unremarkable. PANCREAS/PANCREATIC DUCT: Diffuse pancreatic interstitial edema identified without main duct dilatation or mass. No focal fluid collections identified to suggest pseudocyst. No unenhanced findings of pancreatic necrosis. ADRENAL GLANDS: Unremarkable. KIDNEYS: Unremarkable. LYMPH NODES: No enlarged abdominal lymph nodes. VASCULATURE: Unremarkable. PERITONEUM: No ascites. ABDOMINAL WALL: No hernia. No mass. BOWEL: Grossly unremarkable. No bowel obstruction. BONES: No acute abnormality or worrisome osseous lesion. SOFT TISSUES: Unremarkable. MISCELLANEOUS: Unremarkable. IMPRESSION: 1. Acute interstitial pancreatitis: diffuse pancreatic interstitial edema without main duct dilatation or mass; no pseudocyst or pancreatic necrosis. 2. Gallstones and gallbladder wall edema. In the setting of acute pancreatitis, gallbladder wall edema is a nonspecific finding. 3. No evidence of choledocholithiasis; common bile duct nondilated. Electronically signed by: Waddell Calk MD 03/30/2024 05:10 AM EDT RP Workstation: [id number]   US  ABDOMEN LIMITED RUQ (LIVER/GB) Result Date: 03/30/2024 EXAM: Right Upper Quadrant Abdominal  Ultrasound 03/29/2024 11:27:14 PM TECHNIQUE: Real-time ultrasonography of the right upper quadrant of the abdomen was performed. COMPARISON: None available. CLINICAL HISTORY: Upper abd pain. FINDINGS: LIVER: The liver demonstrates normal echogenicity. No intrahepatic biliary ductal dilatation. No evidence of mass. BILIARY SYSTEM: Multiple gallstones are seen within the gallbladder. The gallbladder is not distended, there is no gallbladder wall thickening, and no pericholecystic fluid is identified. The sonographic Murphy's sign is negative. Common bile duct measures 4 mm in diameter proximally. RIGHT KIDNEY: The right kidney is grossly unremarkable in appearances without evidence of hydronephrosis, echogenic calculi or worrisome mass lesions. OTHER: No right upper quadrant ascites. IMPRESSION: 1. Cholelithiasis without evidence of acute cholecystitis. Electronically signed by: Dorethia Molt MD 03/30/2024 12:02 AM EDT RP Workstation: [id number]   DG Chest 2 View Result Date: 03/29/2024 EXAM: 2 VIEW(S) XRAY OF THE CHEST 03/29/2024 09:17:00 PM COMPARISON: 03/13/2024 CLINICAL HISTORY: CP/SOB. Pt states 2 weeks ago was dx with PE. Tonight at 1730 developed CP and SHOB. Pt states pain to center of his chest. Pt also c/o lower back pain. FINDINGS: LUNGS AND PLEURA: No focal pulmonary opacity. No pulmonary edema. No pleural effusion. No pneumothorax. HEART AND MEDIASTINUM: No acute abnormality of the cardiac and mediastinal silhouettes. BONES AND SOFT TISSUES: No acute osseous abnormality. IMPRESSION: 1. Normal chest radiograph. No acute cardiopulmonary process. Electronically signed by: Franky Stanford MD 03/29/2024 09:46 PM EDT RP Workstation: [id number]  CT Angio Chest PE W and/or Wo Contrast Result Date: 03/13/2024 CLINICAL DATA:  Pulmonary embolism (PE) suspected, high prob EXAM: CT ANGIOGRAPHY CHEST WITH CONTRAST TECHNIQUE: Multidetector CT imaging of the chest was performed using the standard protocol during  bolus administration of intravenous contrast. Multiplanar CT image reconstructions and MIPs were obtained to evaluate the vascular anatomy. RADIATION DOSE REDUCTION: This exam was performed according to the departmental dose-optimization program which includes automated exposure control, adjustment of the mA and/or kV according to patient size and/or use of iterative reconstruction technique. CONTRAST:  75mL OMNIPAQUE  IOHEXOL  350 MG/ML SOLN COMPARISON:  03/13/2024, 02/22/2021 FINDINGS: Pulmonary Embolism: Single segmental pulmonary embolus within the posterior right lower lobe. No findings of right heart strain. Cardiovascular: No cardiomegaly or pericardial effusion.Normal variant 2 vessel aortic arch anatomy with the left common carotid arising from the brachiocephalic artery.No aortic aneurysm. Mediastinum/Nodes: No mediastinal mass.No mediastinal, hilar, or axillary lymphadenopathy. Fluid layering in the mid and upper esophagus. Lungs/Pleura: The midline trachea and bronchi are patent. Posterior bibasilar dependent atelectasis. No focal airspace consolidation, pleural effusion, or pneumothorax. Musculoskeletal: No acute fracture or destructive bone lesion. Small volume symmetric bilateral gynecomastia. Upper Abdomen: No acute abnormality in the partially visualized upper abdomen. Review of the MIP images confirms the above findings. IMPRESSION: 1. Single segmental pulmonary embolus within the posterior right lower lobe. No findings of right heart strain. 2. No pneumonia, pulmonary edema, or pleural effusion. 3. Fluid layering in the mid and upper esophagus, possibly due to incomplete emptying or gastroesophageal reflux disease. Critical Value/emergent results were called by telephone at the time of interpretation on 03/13/2024 at 10:58 am to provider PHILLIP STAFFORD MD , who verbally acknowledged these results. Electronically Signed   By: Rogelia Myers M.D.   On: 03/13/2024 11:02   DG Chest 2 View Result  Date: 03/13/2024 CLINICAL DATA:  Chest pain and shortness of breath EXAM: CHEST - 2 VIEW COMPARISON:  Chest radiograph dated 02/22/2021 FINDINGS: Mildly low lung volumes. No focal consolidations. No pleural effusion or pneumothorax. The heart size and mediastinal contours are within normal limits. No acute osseous abnormality. IMPRESSION: No active cardiopulmonary disease. Electronically Signed   By: Limin  Xu M.D.   On: 03/13/2024 09:49    Microbiology: No results found for this or any previous visit.  Labs: CBC: Recent Labs  Lab 03/29/24 2026 03/30/24 1002 03/31/24 0316 04/01/24 0513 04/02/24 0510  WBC 13.7* 6.5 4.9 4.9 8.8  NEUTROABS  --  5.0  --  2.2 7.1  HGB 17.0 13.6 13.3 14.4 13.9  HCT 49.4 38.1* 38.2* 41.5 39.8  MCV 91.1 89.0 91.2 91.2 90.0  PLT 361 229 216 225 281   Basic Metabolic Panel: Recent Labs  Lab 03/29/24 2026 03/30/24 1002 04/01/24 0513 04/02/24 0510  NA 138 139 141 138  K 4.5 4.0 3.3* 4.1  CL 103 103 105 107  CO2 23 23 27 27   GLUCOSE 137* 92 97 130*  BUN 11 9 9 8   CREATININE 0.86 0.79 0.96 0.91  CALCIUM 9.2 8.7* 8.8* 8.7*  MG  --   --  2.1 2.1  PHOS  --   --  4.1 3.5   Liver Function Tests: Recent Labs  Lab 03/30/24 0054 03/30/24 1002 04/01/24 0513 04/02/24 0510  AST 292* 228* 65* 40  ALT 231* 253* 141* 100*  ALKPHOS 52 57 65 50  BILITOT 2.6* 4.5* 1.2 0.9  PROT 5.5* 5.5* 6.7 6.4*  ALBUMIN 3.1* 3.1* 3.6 3.7   Discharge time spent: greater  than 30 minutes.  Signed: Dr. Sherre [facility] 04/02/2024

## 2024-04-02 NOTE — Anesthesia Postprocedure Evaluation (Signed)
 Anesthesia Post Note  Patient: Matthew Graves  Procedure(s) Performed: CHOLECYSTECTOMY, ROBOT-ASSISTED, LAPAROSCOPIC (Abdomen)  Patient location during evaluation: PACU Anesthesia Type: General Level of consciousness: awake and alert, oriented and patient cooperative Pain management: pain level controlled Vital Signs Assessment: post-procedure vital signs reviewed and stable Respiratory status: spontaneous breathing, nonlabored ventilation and respiratory function stable Cardiovascular status: blood pressure returned to baseline and stable Postop Assessment: adequate PO intake Anesthetic complications: no   No notable events documented.   Last Vitals:  Vitals:   04/02/24 0447 04/02/24 0730  BP: 114/77 121/84  Pulse: 67 66  Resp: 16 16  Temp: 36.9 C 36.4 C  SpO2: 96% 99%    Last Pain:  Vitals:   04/02/24 0730  TempSrc: Oral  PainSc:                  Gregory Barrick

## 2024-04-02 NOTE — Assessment & Plan Note (Signed)
 Status post cholecystectomy on 04/01/2024 Patient has not had a bowel movement however endorses a lot of flatulence Per general surgery, no indications for antibiotic at home. To monitor for ongoing bowel function.  As needed medications include antiemetics as needed.  From surgical standpoint can resume Eliquis . The following prescription: As needed ondansetron  4 mg p.o. every 6 hours as needed for nausea and vomiting, oxycodone  5 mg every 8 hours as needed for severe pain, breakthrough pain, 12 tablets.

## 2024-04-02 NOTE — Plan of Care (Signed)

## 2024-04-02 NOTE — Assessment & Plan Note (Signed)
 Eliquis  5 mg p.o. twice daily were resumed during hospitalization and after surgery Patient requesting apixaban  prescription as he only has enough for 2 more days Apixaban  5 mg p.o. twice daily, prescription written for 1 month supply, 60 tablets Patient states that he understands he needs to follow-up with outpatient hematologist for continuation of prescription as appropriate

## 2024-04-02 NOTE — Progress Notes (Signed)
 Discharge instructions reviewed with patient and mother. IV x2 were taken out. Questions were encouraged and answered. Belongings collected by patient.

## 2024-04-03 LAB — SURGICAL PATHOLOGY

## 2024-04-15 ENCOUNTER — Encounter: Admitting: Physician Assistant

## 2024-04-22 ENCOUNTER — Ambulatory Visit (INDEPENDENT_AMBULATORY_CARE_PROVIDER_SITE_OTHER): Admitting: Physician Assistant

## 2024-04-22 ENCOUNTER — Encounter: Payer: Self-pay | Admitting: Physician Assistant

## 2024-04-22 VITALS — BP 126/82 | HR 94 | Temp 98.2°F | Ht 74.0 in | Wt 186.2 lb

## 2024-04-22 DIAGNOSIS — Z09 Encounter for follow-up examination after completed treatment for conditions other than malignant neoplasm: Secondary | ICD-10-CM

## 2024-04-22 DIAGNOSIS — K851 Biliary acute pancreatitis without necrosis or infection: Secondary | ICD-10-CM

## 2024-04-22 NOTE — Progress Notes (Unsigned)
 Gallstone pancreatitis  10/08 - [place] No issues

## 2024-04-22 NOTE — Patient Instructions (Signed)

## 2024-04-23 DIAGNOSIS — Z5181 Encounter for therapeutic drug level monitoring: Secondary | ICD-10-CM | POA: Diagnosis not present

## 2024-04-23 DIAGNOSIS — F410 Panic disorder [episodic paroxysmal anxiety] without agoraphobia: Secondary | ICD-10-CM | POA: Diagnosis not present

## 2024-04-23 DIAGNOSIS — F331 Major depressive disorder, recurrent, moderate: Secondary | ICD-10-CM | POA: Diagnosis not present
